# Patient Record
Sex: Female | Born: 1965 | Race: Black or African American | Hispanic: No | Smoking: Never smoker
Health system: Southern US, Community
[De-identification: ages and names within clinical notes are randomized; demographics above are authoritative.]

## PROBLEM LIST (undated history)

## (undated) DIAGNOSIS — I10 Essential (primary) hypertension: Secondary | ICD-10-CM

## (undated) HISTORY — PX: WISDOM TOOTH EXTRACTION: SHX21

## (undated) HISTORY — PX: ABDOMINAL HYSTERECTOMY: SHX81

---

## 2014-12-08 ENCOUNTER — Emergency Department (HOSPITAL_BASED_OUTPATIENT_CLINIC_OR_DEPARTMENT_OTHER)
Admission: EM | Admit: 2014-12-08 | Discharge: 2014-12-08 | Disposition: A | Discharge status: Home / Self Care | Payer: Managed Care, Other (non HMO) | Attending: Emergency Medicine | Admitting: Emergency Medicine

## 2014-12-08 ENCOUNTER — Encounter (HOSPITAL_BASED_OUTPATIENT_CLINIC_OR_DEPARTMENT_OTHER): Payer: Self-pay | Admitting: *Deleted

## 2014-12-08 DIAGNOSIS — I10 Essential (primary) hypertension: Secondary | ICD-10-CM | POA: Diagnosis not present

## 2014-12-08 DIAGNOSIS — K088 Other specified disorders of teeth and supporting structures: Secondary | ICD-10-CM | POA: Diagnosis not present

## 2014-12-08 DIAGNOSIS — K0889 Other specified disorders of teeth and supporting structures: Secondary | ICD-10-CM

## 2014-12-08 HISTORY — DX: Essential (primary) hypertension: I10

## 2014-12-08 MED ORDER — AMOXICILLIN 500 MG PO CAPS: 500.0000 mg | ORAL_CAPSULE | Freq: Three times a day (TID) | ORAL | Status: DC

## 2014-12-08 MED ORDER — OXYCODONE HCL 5 MG PO TABS
5.0000 mg | ORAL_TABLET | Freq: Once | ORAL | Status: AC
  Administered 2014-12-08: 5 mg via ORAL
  Filled 2014-12-08: qty 1

## 2014-12-08 MED ORDER — AMOXICILLIN 500 MG PO CAPS
500.0000 mg | ORAL_CAPSULE | Freq: Once | ORAL | Status: AC
  Administered 2014-12-08: 500 mg via ORAL
  Filled 2014-12-08: qty 1

## 2014-12-08 MED ORDER — OXYCODONE HCL 5 MG PO TABS: 5.0000 mg | ORAL_TABLET | ORAL | Status: DC | PRN

## 2014-12-08 NOTE — Discharge Instructions (Signed)
Please follow with your primary care doctor in the next 5 days for high blood pressure evaluation. If you do not have a primary care doctor, present to urgent care. Reduce salt intake. Seek emergency medical care for unilateral weakness, slurring, change in vision, or chest pain and shortness of breath.  You can take acetaminophen up to 3000 mg per day. Do not combine this with alcohol, do not take any other medications that contain acetaminophen (you must read the labels)  Take oxycodone for breakthrough pain, do not drink alcohol, drive, care for children or do other critical tasks while taking oxycodone.  Return to the emergency room for fever, change in vision, redness to the face that rapidly spreads towards the eye, nausea or vomiting, difficulty swallowing or shortness of breath.   Apply warm compresses to jaw throughout the day.   Take your antibiotics as directed and to the end of the course. DO NOT drink alcohol when taking metronidazole, it will make you very sick!   Followup with a dentist is very important for ongoing evaluation and management of recurrent dental pain. Return to emergency department for emergent changing or worsening symptoms."  Low-cost dental clinic: [facility]  at [phone]**  **Nuala Alpha at [phone] [address]    You may also call [phone]  Dental Assistance If the dentist on-call cannot see you, please use the resources below:   Patients with Medicaid: [facility] [phone] [address], [phone] [phone]  If unable to pay, or uninsured, contact [facility] [phone]) or [facility] Department [phone] in [place], [phone] in [facility]) to become qualified for the adult dental clinic  Other Low-Cost Community Dental Services: [facility]- [address] Natasha Bence [place], [place], [address]    [phone], Ext. [phone]    2nd and 4th Thursday of the month at 6:30am    10  clients each day by appointment, can sometimes see walk-in     patients if someone does not show for an appointment [facility]- [address] Ether Griffins [place], [place], [address]    [phone] [facility] [address], [place], [place], [address]    [phone]  [facility]- [phone] [facility]- [phone] [facility]- [phone]

## 2014-12-08 NOTE — ED Notes (Signed)
L side dental pain/jaw pain, upper and lower jaw

## 2014-12-08 NOTE — ED Provider Notes (Signed)
CSN: [id number]     Arrival date & time 12/08/14  1623 History   First MD Initiated Contact with Patient 12/08/14 1637     Chief Complaint  Patient presents with  . Dental Pain     (Consider location/radiation/quality/duration/timing/severity/associated sxs/prior Treatment) HPI   Blood pressure 161/97, pulse 80, temperature 98.7 F (37.1 C), temperature source Oral, resp. rate 18, height 5\' 3"  (1.6 m), weight 200 lb (90.719 kg), SpO2 99 %.  Raven Coleman is a 49 y.o. female complaining of left upper and lower dental pain which she rates at 6 out of 10 onset 3 days ago. Patient has taken acetaminophen extra strength for a total of 1000 mg one hour ago in addition to 600 mg of naproxen with little relief. She states that the pain is severe and keeping her from sleeping at night. Denies fever/chills, difficulty opening jaw, difficulty swallowing, SOB, gum swelling, facial swelling, neck swelling, chest pain, shortness of breath.    Past Medical History  Diagnosis Date  . Hypertension    Past Surgical History  Procedure Laterality Date  . Abdominal hysterectomy    . Wisdom tooth extraction     No family history on file. History  Substance Use Topics  . Smoking status: Never Smoker   . Smokeless tobacco: Not on file  . Alcohol Use: No   OB History    No data available     Review of Systems  10 systems reviewed and found to be negative, except as noted in the HPI.  Allergies  Review of patient's allergies indicates no known allergies.  Home Medications   Prior to Admission medications   Medication Sig Start Date End Date Taking? Authorizing Provider  LISINOPRIL PO Take by mouth.   Yes Historical Provider, MD  amoxicillin (AMOXIL) 500 MG capsule Take 1 capsule (500 mg total) by mouth 3 (three) times daily. 12/08/14   Mahmoud Blazejewski, PA-C  oxyCODONE (ROXICODONE) 5 MG immediate release tablet Take 1 tablet (5 mg total) by mouth every 4 (four) hours as needed. Take 1-2  tablets every 4-6 hours as needed for pain control 12/08/14   Joni Reining Jaaziah Schulke, PA-C   BP 161/97 mmHg  Pulse 80  Temp(Src) 98.7 F (37.1 C) (Oral)  Resp 18  Ht 5\' 3"  (1.6 m)  Wt 200 lb (90.719 kg)  BMI 35.44 kg/m2  SpO2 99% Physical Exam  Constitutional: She is oriented to person, place, and time. She appears well-developed and well-nourished. No distress.  HENT:  Head: Normocephalic.  Mouth/Throat: Uvula is midline and oropharynx is clear and moist. No trismus in the jaw. No uvula swelling. No oropharyngeal exudate, posterior oropharyngeal edema, posterior oropharyngeal erythema or tonsillar abscesses.  Generally poor dentition, no gingival swelling, erythema or tenderness to palpation. Patient is handling their secretions. There is no tenderness to palpation or firmness underneath tongue bilaterally. No trismus.    Eyes: Conjunctivae and EOM are normal.  Cardiovascular: Normal rate.   Pulmonary/Chest: Effort normal. No stridor.  Musculoskeletal: Normal range of motion.  Lymphadenopathy:    She has no cervical adenopathy.  Neurological: She is alert and oriented to person, place, and time.  Psychiatric: She has a normal mood and affect.  Nursing note and vitals reviewed.   ED Course  Procedures (including critical care time) Labs Review Labs Reviewed - No data to display  Imaging Review No results found.   EKG Interpretation None      MDM   Final diagnoses:  Pain, dental  Filed Vitals:   12/08/14 1630  BP: 161/97  Pulse: 80  Temp: 98.7 F (37.1 C)  TempSrc: Oral  Resp: 18  Height:  (1.6 m)  Weight: 200 lb (90.719 kg)  SpO2: 99%    Medications  amoxicillin (AMOXIL) capsule 500 mg (not administered)  oxyCODONE (Oxy IR/ROXICODONE) immediate release tablet 5 mg (not administered)    Raven Coleman is a pleasant 49 y.o. female presenting with dental pain associated with dental caries but no signs or symptoms of dental abscess. Patient afebrile, non  toxic appearing and swallowing secretions well. I gave patient referral to dentist and stressed the importance of dental follow up for definitive management of dental issues. Patient voices understanding and is agreeable to plan.  Evaluation does not show pathology that would require ongoing emergent intervention or inpatient treatment. Pt is hemodynamically stable and mentating appropriately. Discussed findings and plan with patient/guardian, who agrees with care plan. All questions answered. Return precautions discussed and outpatient follow up given.   New Prescriptions   AMOXICILLIN (AMOXIL) 500 MG CAPSULE    Take 1 capsule (500 mg total) by mouth 3 (three) times daily.   OXYCODONE (ROXICODONE) 5 MG IMMEDIATE RELEASE TABLET    Take 1 tablet (5 mg total) by mouth every 4 (four) hours as needed. Take 1-2 tablets every 4-6 hours as needed for pain control         Wynetta Emery, PA-C 12/08/14 1653  Benjiman Core, MD 12/11/14 1414

## 2015-02-02 ENCOUNTER — Encounter (HOSPITAL_BASED_OUTPATIENT_CLINIC_OR_DEPARTMENT_OTHER): Payer: Self-pay | Admitting: *Deleted

## 2015-02-02 ENCOUNTER — Emergency Department (HOSPITAL_BASED_OUTPATIENT_CLINIC_OR_DEPARTMENT_OTHER)
Admission: EM | Admit: 2015-02-02 | Discharge: 2015-02-02 | Disposition: A | Discharge status: Home / Self Care | Payer: Managed Care, Other (non HMO) | Attending: Physician Assistant | Admitting: Physician Assistant

## 2015-02-02 DIAGNOSIS — H9209 Otalgia, unspecified ear: Secondary | ICD-10-CM | POA: Diagnosis not present

## 2015-02-02 DIAGNOSIS — J029 Acute pharyngitis, unspecified: Secondary | ICD-10-CM | POA: Diagnosis present

## 2015-02-02 DIAGNOSIS — Z792 Long term (current) use of antibiotics: Secondary | ICD-10-CM | POA: Diagnosis not present

## 2015-02-02 DIAGNOSIS — H578 Other specified disorders of eye and adnexa: Secondary | ICD-10-CM | POA: Diagnosis not present

## 2015-02-02 DIAGNOSIS — J302 Other seasonal allergic rhinitis: Secondary | ICD-10-CM | POA: Diagnosis not present

## 2015-02-02 DIAGNOSIS — I10 Essential (primary) hypertension: Secondary | ICD-10-CM | POA: Diagnosis not present

## 2015-02-02 LAB — RAPID STREP SCREEN (MED CTR MEBANE ONLY): Streptococcus, Group A Screen (Direct): NEGATIVE

## 2015-02-02 MED ORDER — CETIRIZINE-PSEUDOEPHEDRINE ER 5-120 MG PO TB12: 1.0000 | ORAL_TABLET | Freq: Two times a day (BID) | ORAL | Status: DC

## 2015-02-02 NOTE — ED Provider Notes (Signed)
CSN: [id number]     Arrival date & time 02/02/15  1514 History   First MD Initiated Contact with Patient 02/02/15 1645     Chief Complaint  Patient presents with  . Sore Throat     (Consider location/radiation/quality/duration/timing/severity/associated sxs/prior Treatment) HPI Comments: Pt is a 49 yo female who presents to the ED with complaint of sore throat, on 1 week. She also endorses associated rhinorrhea, ear pain, sneezing, watering of eyes. Denies fever, chills, headahce, visual changes, cough, SOB, CP, abdominal pain, N/V/D, urinary sxs, numbness, tingling, weakness. Denies using any form of tx PTA. Denies any sick contacts.    Past Medical History  Diagnosis Date  . Hypertension    Past Surgical History  Procedure Laterality Date  . Abdominal hysterectomy    . Wisdom tooth extraction     No family history on file. Social History  Substance Use Topics  . Smoking status: Never Smoker   . Smokeless tobacco: Never Used  . Alcohol Use: No   OB History    No data available     Review of Systems  Constitutional: Negative for fever and chills.  HENT: Positive for ear pain, rhinorrhea, sneezing and sore throat. Negative for congestion and sinus pressure.   Eyes: Positive for discharge. Negative for redness and itching.  Respiratory: Negative for cough and shortness of breath.   Cardiovascular: Negative for chest pain.  Gastrointestinal: Negative for nausea, vomiting and abdominal pain.  Neurological: Negative for weakness and headaches.      Allergies  Review of patient's allergies indicates no known allergies.  Home Medications   Prior to Admission medications   Medication Sig Start Date End Date Taking? Authorizing Provider  Naproxen (NAPROSYN PO) Take by mouth.   Yes Historical Provider, MD  amoxicillin (AMOXIL) 500 MG capsule Take 1 capsule (500 mg total) by mouth 3 (three) times daily. 12/08/14   Nicole Pisciotta, PA-C  LISINOPRIL PO Take by mouth.     Historical Provider, MD  oxyCODONE (ROXICODONE) 5 MG immediate release tablet Take 1 tablet (5 mg total) by mouth every 4 (four) hours as needed. Take 1-2 tablets every 4-6 hours as needed for pain control 12/08/14   Joni Reining Pisciotta, PA-C   BP 144/72 mmHg  Pulse 72  Temp(Src) 98.6 F (37 C) (Oral)  Resp 18  Ht  (1.6 m)  Wt 200 lb (90.719 kg)  BMI 35.44 kg/m2  SpO2 100% Physical Exam  Constitutional: She is oriented to person, place, and time. She appears well-developed and well-nourished.  HENT:  Head: Normocephalic and atraumatic.  Right Ear: Tympanic membrane and external ear normal.  Left Ear: Tympanic membrane and external ear normal.  Nose: Rhinorrhea present. Right sinus exhibits no maxillary sinus tenderness and no frontal sinus tenderness. Left sinus exhibits no maxillary sinus tenderness and no frontal sinus tenderness.  Mouth/Throat: Uvula is midline, oropharynx is clear and moist and mucous membranes are normal. No oropharyngeal exudate.  Eyes: Conjunctivae and EOM are normal. Pupils are equal, round, and reactive to light. Right eye exhibits no chemosis and no discharge. Left eye exhibits no chemosis and no discharge. Right conjunctiva is not injected. No scleral icterus.  Neck: Normal range of motion. Neck supple.  Cardiovascular: Normal rate, regular rhythm, normal heart sounds and intact distal pulses.   No murmur heard. Pulmonary/Chest: Effort normal and breath sounds normal. No respiratory distress. She has no wheezes. She has no rales. She exhibits no tenderness.  Abdominal: Soft. Bowel sounds are normal. She  exhibits no distension and no mass. There is no tenderness. There is no rebound and no guarding.  Musculoskeletal: She exhibits no edema.  Lymphadenopathy:    She has cervical adenopathy (left submandibular ).  Neurological: She is alert and oriented to person, place, and time.  Skin: Skin is warm and dry.  Nursing note and vitals reviewed.   ED Course   Procedures (including critical care time) Labs Review Labs Reviewed  RAPID STREP SCREEN (NOT AT [facility])  CULTURE, GROUP A STREP    Imaging Review No results found. I have personally reviewed and evaluated these images and lab results as part of my medical decision-making.  Filed Vitals:   02/02/15 1750  BP: 152/80  Pulse: 64  Temp:   Resp: 18     MDM   Final diagnoses:  Seasonal allergies    Pt presents with sore throat, sneezing, rhinorrhea, watering of eyes and ear pain for the past week. Denies sick contacts. VSS. Exam only revealed rhinorrhea and submandibular lymphadenopathy. Strep negative. Pt reports having allergies but does not take any meds. I suspect pt's presentation is most likely due to allergies. Plan to d/c pt home with rx for zyrtec. Pt advised to follow up with PCP, given resource guide.  Evaluation does not show pathology requring ongoing emergent intervention or admission. Pt is hemodynamically stable and mentating appropriately. Discussed findings/results and plan with patient/guardian, who agrees with plan. All questions answered. Return precautions discussed and outpatient follow up given.      Satira Sark [place], [place] 02/03/15 1239  Courteney Randall An, MD 02/06/15 [phone]

## 2015-02-02 NOTE — Discharge Instructions (Signed)
Please take your prescriptions as prescribed. Please follow up with a primary care provider from the Resource Guide provided below in 1 week. Please return to the Emergency Department if symptoms worsen or new onset of fever, chills, difficulty breathing, chest pain.    Emergency Department Resource Guide 1) Find a Doctor and Pay Out of Pocket Although you won't have to find out who is covered by your insurance plan, it is a good idea to ask around and get recommendations. You will then need to call the office and see if the doctor you have chosen will accept you as a new patient and what types of options they offer for patients who are self-pay. Some doctors offer discounts or will set up payment plans for their patients who do not have insurance, but you will need to ask so you aren't surprised when you get to your appointment.  2) Contact Your Local Health Department Not all health departments have doctors that can see patients for sick visits, but many do, so it is worth a call to see if yours does. If you don't know where your local health department is, you can check in your phone book. The CDC also has a tool to help you locate your state's health department, and many state websites also have listings of all of their local health departments.  3) Find a Walk-in Clinic If your illness is not likely to be very severe or complicated, you may want to try a walk in clinic. These are popping up all over the country in pharmacies, drugstores, and shopping centers. They're usually staffed by nurse practitioners or physician assistants that have been trained to treat common illnesses and complaints. They're usually fairly quick and inexpensive. However, if you have serious medical issues or chronic medical problems, these are probably not your best option.  No Primary Care Doctor: - Call Health Connect at  [phone] - they can help you locate a primary care doctor that  accepts your insurance, provides  certain services, etc. - Physician Referral Service- [phone]  Chronic Pain Problems: Organization         Address  Phone   Notes  [facility]  [phone] Patients need to be referred by their primary care doctor.   Medication Assistance: Organization         Address  Phone   Notes  [facility] Medication [facility] [address] [place] [place], [place] [address] [phone] --Must be a resident of [facility] -- Must have NO insurance coverage whatsoever (no Medicaid/ Medicare, etc.) -- The pt. MUST have a primary care doctor that directs their care regularly and follows them in the community   MedAssist  [phone]   [employer]  [phone]    Agencies that provide inexpensive medical care: Organization         Address  Phone   Notes  [facility]  [phone]   [facility]    [phone]   [facility] [address] [place], [place] [address] [phone]   [facility] [address] [place], [place] [phone]   Planned Parenthood    [phone]   [facility]    [phone]   [facility]  [address], [place] Phone:  [phone], Fax:  [phone] Hours of Operation:  9 am - 6 pm, M-F.  Also  accepts Medicaid/Medicare and self-pay.  [facility] for [place], [address], [place] Phone: [phone], Fax: [phone]. Hours of Operation:  8:30 am - 5:30 pm, M-F.  Also accepts Medicaid and self-pay.  [facility] [place] [address] [place] Phone: [phone]   [place], [place], [place] [phone], Ext. [phone] Mondays & Thursdays: 7-9 AM.  First 15 patients are seen on a first come, first serve basis.    [place] Providers:  Organization         Address  Phone   Notes  [facility] [address], [place] [phone] Also accepts self-pay patients.  [facility] [address], [place]  [phone]   [place], [address], [place] [phone]   [facility] [address] [place] [phone]   Lucianne Lei [address], [place]   [phone] Only accepts [place] Access [place] patients after they have their name applied to their card.   Self-Pay (no insurance) in [facility]:  Organization         Address  Phone   Notes  Sickle Cell Patients, [facility] [place] [phone]   [facility] [place] [phone]   [facility] [place]  [place], [place], [place] [phone]   [facility]/Dr. Osei-Bonsu  [address] [place] or [address], [place] [phone] Phone number for both [place] and [place] locations is the same.  [facility] [address] [place] [phone]   [facility] [address] [place] or [address] [phone]   [facility] [address], [place] [phone], phone; [phone], fax Sees patients 1st and 3rd Saturday of every month.  Must not qualify for public or private insurance (i.e. Medicaid, Medicare, [place] Health Choice, Veterans' Benefits)  Household income should be no more than 200% of the poverty level The clinic cannot treat you if you are pregnant or think you are pregnant  Sexually transmitted diseases are not treated at the clinic.    Dental Care: Organization         Address  Phone  Notes  [facility] Department of [facility] [place] [phone]  Accepts children up to age 94 who are enrolled in [place] or [place]; pregnant women with a Medicaid card; and children who have applied for Medicaid or [place] Health Choice, but were declined, whose parents can pay a reduced fee at time of service.  [facility] Department of [facility]  [address], [place] [phone] Accepts children up to age 3 who are enrolled in [place] or [place]; pregnant women with a Medicaid card; and children who have applied for Medicaid or [place] Health Choice, but were declined, whose parents can pay a reduced fee at time of service.  [place] Adult [facility] Access PROGRAM  [place] [phone] Patients are seen by appointment only. Walk-ins are not accepted. [place] will see patients 32 years of age and older. Monday - Tuesday (8am-5pm)  Most Wednesdays (8:30-5pm) $30 per visit, cash only  [facility] Adult [employer] PROGRAM  [address], [facility] [phone] Patients are seen by appointment only. Walk-ins are not accepted. [place] will see patients 73 years of age and older. One Wednesday Evening (Monthly: Volunteer Based).  $30 per visit, cash only  [place]  [phone] for adults; Children under age 71, call Graduate Pediatric Dentistry at [phone]. Children aged 11-14, please call [phone] to request a pediatric application.  Dental services are provided in all areas of dental care including fillings, crowns and bridges, complete and partial dentures, implants, gum treatment, root canals, and extractions. Preventive care is also provided. Treatment is provided to both adults and children. Patients are selected via a lottery and there is often a waiting list.   [facility] [address], [place]  [phone] [email or web address]   [facility] [address] [place], [place] [phone], Ext. [phone] Second  and Fourth Thursday of each month, opens at 6:30 AM; Clinic ends at 9 AM.  Patients are seen on a first-come first-served basis, and a limited number are seen during each clinic.   [facility]  [address] [place] Danker [place], [place] [phone]   Eligibility Requirements You must have lived in [place], [place], or [place] counties for at least the last three months.   You cannot be eligible for state or federal sponsored [employer], including [employer], [place], or [employer].   You generally cannot be eligible for healthcare insurance through your employer.    How to apply: Eligibility screenings are held every Tuesday and Wednesday afternoon from 1:00 pm until 4:00 pm. You do not need an appointment for the interview!  [facility] [address] [place], [place]   [place]  [place] Department  Day  [phone]    Behavioral Health Resources in the Community: Intensive Outpatient Programs Organization         Address  Phone  Notes  [place] [facility]. [address], [place], [place] [phone]   [facility] Outpatient [address], [place], [place]   ADS: Alcohol & Drug Svcs [address], [place], [place]   [place] [address]  [place], [place] or [phone]   Substance Abuse Resources Organization         Address  Phone  Notes  Alcohol and Drug Services  [phone]   [place]  [phone]   [facility] [place]   Chinita Pester  [phone]   Residential & Outpatient Substance Abuse Program  [phone]   Psychological Services Organization         Address  Phone  Notes  [facility] [place]  [place]  [phone]   [place] [address], [place] or [phone]    Mobile Crisis Teams Organization         Address  Phone  Notes  [facility], Mobile Crisis Care Unit  [phone]   [facility]  [address] [place], [place]   [place] Levels [address], [place] [phone]    Self-Help/Support Groups Organization         Address  Phone  Notes  [facility] of [place] - variety of support groups  [place] Call for more information  Narcotics Anonymous (NA), [facility] [address], [employer]   2 meetings at this location   Special educational needs teacher         Address  Phone  Notes  ASAP Residential Treatment [place],    [place]  [phone]   [facility]  [address] [place] 326712, [place], [place]   [place], [place] [phone] Admissions: 8am-3pm M-F  Incentives Substance [place] [address]    [place], [place] [phone]   [facility] [address] [place], [place], [place]   [facility] [address],  [place], [place]   [facility]ight Programs - Intensive Outpatient [address], [place], [place]   [facility] ([place].,  [place], [place] [phone] or [phone]   [facility] (RTS) [address] [place], [place] Accepts Medicaid  [facility] [place] [address]  [place] [phone] Substance Abuse/Addiction Treatment   [facility] Organization         Address  Phone  Notes  [facility]  [phone]   Domenic Schwab, PhD [address] Arlis [place], [place]   [phone] or [phone]   [place], [place]  [phone]   [facility] [address] [place], [place] [phone] Insurance/Medicaid/sponsorship through [facility] and Families [address]                                    [place], [place] [phone] [place] [address], [place] [phone]    Dr. Adele Schilder  [phone]   [facility] Dept. 1) [address], [place] 2) [place] 3)  [place] [address], [place] [phone]  [phone]   [place] [phone] or [phone] (After Hours)

## 2015-02-02 NOTE — ED Notes (Signed)
Pt reports sore throat, headache, ear pain x 1 week

## 2015-02-05 LAB — CULTURE, GROUP A STREP: STREP A CULTURE: NEGATIVE

## 2015-02-06 ENCOUNTER — Emergency Department (HOSPITAL_BASED_OUTPATIENT_CLINIC_OR_DEPARTMENT_OTHER)
Admission: EM | Admit: 2015-02-06 | Discharge: 2015-02-06 | Disposition: A | Discharge status: Home / Self Care | Payer: Managed Care, Other (non HMO) | Attending: Emergency Medicine | Admitting: Emergency Medicine

## 2015-02-06 ENCOUNTER — Encounter (HOSPITAL_BASED_OUTPATIENT_CLINIC_OR_DEPARTMENT_OTHER): Payer: Self-pay | Admitting: *Deleted

## 2015-02-06 DIAGNOSIS — Z79899 Other long term (current) drug therapy: Secondary | ICD-10-CM | POA: Diagnosis not present

## 2015-02-06 DIAGNOSIS — Z792 Long term (current) use of antibiotics: Secondary | ICD-10-CM | POA: Diagnosis not present

## 2015-02-06 DIAGNOSIS — K0889 Other specified disorders of teeth and supporting structures: Secondary | ICD-10-CM | POA: Diagnosis not present

## 2015-02-06 DIAGNOSIS — I10 Essential (primary) hypertension: Secondary | ICD-10-CM | POA: Diagnosis not present

## 2015-02-06 MED ORDER — PENICILLIN V POTASSIUM 250 MG PO TABS
500.0000 mg | ORAL_TABLET | Freq: Once | ORAL | Status: AC
  Administered 2015-02-06: 500 mg via ORAL
  Filled 2015-02-06: qty 2

## 2015-02-06 MED ORDER — TRAMADOL HCL 50 MG PO TABS: 50.0000 mg | ORAL_TABLET | Freq: Four times a day (QID) | ORAL | Status: DC | PRN

## 2015-02-06 MED ORDER — PENICILLIN V POTASSIUM 500 MG PO TABS: 500.0000 mg | ORAL_TABLET | Freq: Three times a day (TID) | ORAL | Status: DC

## 2015-02-06 NOTE — ED Provider Notes (Signed)
CSN: [id number]     Arrival date & time 02/06/15  4098 History   First MD Initiated Contact with Patient 02/06/15 1847     Chief Complaint  Patient presents with  . Dental Pain     (Consider location/radiation/quality/duration/timing/severity/associated sxs/prior Treatment) HPI Comments: Patient presents with dental pain. She states her left upper tooth it's hurt for about a week. It's gradually been getting worse. She denies any facial swelling. There is no nausea vomiting or fevers. She's been using over-the-counter medicines without relief. She has an appointment to follow-up with her dentist next week.  Patient is a 49 y.o. female presenting with tooth pain.  Dental Pain Associated symptoms: no congestion, no facial swelling, no fever and no headaches     Past Medical History  Diagnosis Date  . Hypertension    Past Surgical History  Procedure Laterality Date  . Abdominal hysterectomy    . Wisdom tooth extraction     No family history on file. Social History  Substance Use Topics  . Smoking status: Never Smoker   . Smokeless tobacco: Never Used  . Alcohol Use: No   OB History    No data available     Review of Systems  Constitutional: Negative for fever and fatigue.  HENT: Positive for dental problem. Negative for congestion, facial swelling, postnasal drip and sore throat.   Gastrointestinal: Negative for nausea and vomiting.  Skin: Negative for rash and wound.  Neurological: Negative for light-headedness and headaches.      Allergies  Review of patient's allergies indicates no known allergies.  Home Medications   Prior to Admission medications   Medication Sig Start Date End Date Taking? Authorizing Provider  amoxicillin (AMOXIL) 500 MG capsule Take 1 capsule (500 mg total) by mouth 3 (three) times daily. 12/08/14   Nicole Pisciotta, PA-C  cetirizine-pseudoephedrine (ZYRTEC-D) 5-120 MG tablet Take 1 tablet by mouth 2 (two) times daily. 02/02/15   Barrett Henle, PA-C  LISINOPRIL PO Take by mouth.    Historical Provider, MD  Naproxen (NAPROSYN PO) Take by mouth.    Historical Provider, MD  oxyCODONE (ROXICODONE) 5 MG immediate release tablet Take 1 tablet (5 mg total) by mouth every 4 (four) hours as needed. Take 1-2 tablets every 4-6 hours as needed for pain control 12/08/14   Joni Reining Pisciotta, PA-C  penicillin v potassium (VEETID) 500 MG tablet Take 1 tablet (500 mg total) by mouth 3 (three) times daily. 02/06/15   Rolan Bucco, MD  traMADol (ULTRAM) 50 MG tablet Take 1 tablet (50 mg total) by mouth every 6 (six) hours as needed. 02/06/15   Rolan Bucco, MD   BP 147/74 mmHg  Pulse 78  Temp(Src) 97.8 F (36.6 C) (Oral)  Resp 20  Ht  (1.6 m)  Wt 204 lb (92.534 kg)  BMI 36.15 kg/m2  SpO2 100% Physical Exam  Constitutional: She is oriented to person, place, and time. She appears well-developed and well-nourished.  HENT:  Positive tenderness along the left upper bicuspid. There is no facial swelling. No induration or fluctuance. No trismus.  Cardiovascular: Normal rate.   Pulmonary/Chest: Effort normal.  Neurological: She is alert and oriented to person, place, and time.  Skin: Skin is warm and dry.    ED Course  Procedures (including critical care time) Labs Review Labs Reviewed - No data to display  Imaging Review No results found. I have personally reviewed and evaluated these images and lab results as part of my medical decision-making.  EKG Interpretation None      MDM   Final diagnoses:  Pain, dental    Patient presents with dental pain. She has no evidence of a periapical abscess. She was treated with penicillin and tramadol. She has an appointment to follow-up next week with her dentist.    Rolan Bucco, MD 02/06/15 1910

## 2015-02-06 NOTE — ED Notes (Signed)
Dental pain for a week. She cannot see her MD til next week.

## 2015-10-20 ENCOUNTER — Encounter (HOSPITAL_BASED_OUTPATIENT_CLINIC_OR_DEPARTMENT_OTHER): Payer: Self-pay | Admitting: *Deleted

## 2015-10-20 ENCOUNTER — Emergency Department (HOSPITAL_BASED_OUTPATIENT_CLINIC_OR_DEPARTMENT_OTHER): Payer: Managed Care, Other (non HMO)

## 2015-10-20 ENCOUNTER — Emergency Department (HOSPITAL_BASED_OUTPATIENT_CLINIC_OR_DEPARTMENT_OTHER)
Admission: EM | Admit: 2015-10-20 | Discharge: 2015-10-21 | Disposition: A | Discharge status: Home / Self Care | Payer: Managed Care, Other (non HMO) | Attending: Emergency Medicine | Admitting: Emergency Medicine

## 2015-10-20 DIAGNOSIS — Z79899 Other long term (current) drug therapy: Secondary | ICD-10-CM | POA: Insufficient documentation

## 2015-10-20 DIAGNOSIS — S9032XA Contusion of left foot, initial encounter: Secondary | ICD-10-CM

## 2015-10-20 DIAGNOSIS — W228XXA Striking against or struck by other objects, initial encounter: Secondary | ICD-10-CM | POA: Diagnosis not present

## 2015-10-20 DIAGNOSIS — I10 Essential (primary) hypertension: Secondary | ICD-10-CM | POA: Diagnosis not present

## 2015-10-20 DIAGNOSIS — Y9289 Other specified places as the place of occurrence of the external cause: Secondary | ICD-10-CM | POA: Diagnosis not present

## 2015-10-20 DIAGNOSIS — S99922A Unspecified injury of left foot, initial encounter: Secondary | ICD-10-CM | POA: Diagnosis present

## 2015-10-20 DIAGNOSIS — Y939 Activity, unspecified: Secondary | ICD-10-CM | POA: Insufficient documentation

## 2015-10-20 DIAGNOSIS — Y990 Civilian activity done for income or pay: Secondary | ICD-10-CM | POA: Diagnosis not present

## 2015-10-20 MED ORDER — IBUPROFEN 800 MG PO TABS: 800.0000 mg | ORAL_TABLET | Freq: Three times a day (TID) | ORAL | Status: DC | PRN

## 2015-10-20 NOTE — ED Notes (Signed)
Pt made aware to return if symptoms worsen or if any life threatening symptoms occur.   

## 2015-10-20 NOTE — Discharge Instructions (Signed)
Return here as needed. Follow up with your foot doctor. Ice and elevate your foot.

## 2015-10-20 NOTE — ED Notes (Signed)
Pt requesting to speak to PA, PA notified.

## 2015-10-20 NOTE — ED Notes (Addendum)
Pain in her feet for 5 years. 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 She was diagnosed with plantar fasciitis by a foot specialist at the time of the pain. She has had worsened pain and a bruise on the side of her left foot for a month.

## 2015-10-22 NOTE — ED Provider Notes (Signed)
CSN: [id number]     Arrival date & time 10/20/15  1848 History   First MD Initiated Contact with Patient 10/20/15 2001     Chief Complaint  Patient presents with  . Foot Pain     (Consider location/radiation/quality/duration/timing/severity/associated sxs/prior Treatment) HPI Patient presents to the emergency department with left foot pain that has been worse over the last few days.  Patient said she has chronic foot pain from plantar fasciitis.  The patient states she noticed some bruising along the left lateral aspect of her foot.  Patient states that she does not recall an injury other than a box hitting her in the foot at work.  The patient states nothing seems to make the condition better, but standing and movement make the pain worse.  The patient states she did not take any medications prior to arrival.  Patient denies any numbness or weakness in the foot Past Medical History  Diagnosis Date  . Hypertension    Past Surgical History  Procedure Laterality Date  . Abdominal hysterectomy    . Wisdom tooth extraction     No family history on file. Social History  Substance Use Topics  . Smoking status: Never Smoker   . Smokeless tobacco: Never Used  . Alcohol Use: No   OB History    No data available     Review of Systems  All other systems negative except as documented in the HPI. All pertinent positives and negatives as reviewed in the HPI.  Allergies  Review of patient's allergies indicates no known allergies.  Home Medications   Prior to Admission medications   Medication Sig Start Date End Date Taking? Authorizing Provider  HYDROCHLOROTHIAZIDE PO Take by mouth.   Yes Historical Provider, MD  LISINOPRIL PO Take by mouth.   Yes Historical Provider, MD  Naproxen (NAPROSYN PO) Take by mouth.   Yes Historical Provider, MD  amoxicillin (AMOXIL) 500 MG capsule Take 1 capsule (500 mg total) by mouth 3 (three) times daily. 12/08/14   Nicole Pisciotta, PA-C    cetirizine-pseudoephedrine (ZYRTEC-D) 5-120 MG tablet Take 1 tablet by mouth 2 (two) times daily. 02/02/15   Barrett Henle, PA-C  ibuprofen (ADVIL,MOTRIN) 800 MG tablet Take 1 tablet (800 mg total) by mouth every 8 (eight) hours as needed. 10/20/15   Charlestine Night, PA-C  oxyCODONE (ROXICODONE) 5 MG immediate release tablet Take 1 tablet (5 mg total) by mouth every 4 (four) hours as needed. Take 1-2 tablets every 4-6 hours as needed for pain control 12/08/14   Joni Reining Pisciotta, PA-C  penicillin v potassium (VEETID) 500 MG tablet Take 1 tablet (500 mg total) by mouth 3 (three) times daily. 02/06/15   Rolan Bucco, MD  traMADol (ULTRAM) 50 MG tablet Take 1 tablet (50 mg total) by mouth every 6 (six) hours as needed. 02/06/15   Rolan Bucco, MD   BP 151/102 mmHg  Pulse 80  Temp(Src) 97.9 F (36.6 C) (Oral)  Resp 20  Ht  (1.6 m)  Wt 90.719 kg  BMI 35.44 kg/m2  SpO2 100% Physical Exam  Constitutional: She is oriented to person, place, and time. She appears well-developed and well-nourished.  HENT:  Head: Normocephalic and atraumatic.  Pulmonary/Chest: Effort normal.  Musculoskeletal:       Feet:  Neurological: She is alert and oriented to person, place, and time.  Skin: Skin is warm and dry. No rash noted. No erythema.    ED Course  Procedures (including critical care time) Labs Review Labs  Reviewed - No data to display  Imaging Review Dg Foot Complete Left  10/20/2015  CLINICAL DATA:  Pain in her feet for 5 years. 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 She was diagnosed with plantar fasciitis by a foot specialist at the time of the pain. She has had worsened pain and a bruise on the side of her left foot for a month. EXAM: LEFT FOOT - COMPLETE 3+ VIEW COMPARISON:  None. FINDINGS: There is no evidence of fracture or dislocation. Small calcaneal spur at the plantar aponeurosis. There is no evidence of arthropathy or other focal bone abnormality. Soft tissues are unremarkable. IMPRESSION:  Calcaneal spur.  Otherwise negative. Electronically Signed   By: Corlis Leak  Hassell M.D.   On: 10/20/2015 20:37   I have personally reviewed and evaluated these images and lab results as part of my medical decision-making.   EKG Interpretation None      MDM   Final diagnoses:  Foot contusion, left, initial encounter    It should be treated for contusion of the foot.  Told to follow up the foot.  Dr. she is seen in the past.  Patient agrees the plan and all questions were answered.  I advised ice and elevate her foot   Charlestine NightChristopher Nyashia Raney, PA-C 10/22/15 0214  Lyndal Pulleyaniel Knott, MD 10/22/15 [phone]

## 2016-05-09 ENCOUNTER — Emergency Department (HOSPITAL_BASED_OUTPATIENT_CLINIC_OR_DEPARTMENT_OTHER)
Admission: EM | Admit: 2016-05-09 | Discharge: 2016-05-09 | Disposition: A | Discharge status: Home / Self Care | Payer: 59 | Attending: Emergency Medicine | Admitting: Emergency Medicine

## 2016-05-09 ENCOUNTER — Encounter (HOSPITAL_BASED_OUTPATIENT_CLINIC_OR_DEPARTMENT_OTHER): Payer: Self-pay | Admitting: Emergency Medicine

## 2016-05-09 DIAGNOSIS — J3489 Other specified disorders of nose and nasal sinuses: Secondary | ICD-10-CM | POA: Diagnosis present

## 2016-05-09 DIAGNOSIS — Z79899 Other long term (current) drug therapy: Secondary | ICD-10-CM | POA: Insufficient documentation

## 2016-05-09 DIAGNOSIS — J310 Chronic rhinitis: Secondary | ICD-10-CM

## 2016-05-09 DIAGNOSIS — I10 Essential (primary) hypertension: Secondary | ICD-10-CM | POA: Diagnosis not present

## 2016-05-09 MED ORDER — MOMETASONE FUROATE 50 MCG/ACT NA SUSP: 2.0000 | Freq: Two times a day (BID) | NASAL | 12 refills | Status: DC

## 2016-05-09 MED ORDER — OXYMETAZOLINE HCL 0.05 % NA SOLN
1.0000 | Freq: Once | NASAL | Status: AC
  Administered 2016-05-09: 1 via NASAL
  Filled 2016-05-09: qty 15

## 2016-05-09 MED ORDER — MOMETASONE FUROATE 50 MCG/ACT NA SUSP: 2.0000 | Freq: Two times a day (BID) | NASAL | 0 refills | Status: DC

## 2016-05-09 NOTE — ED Triage Notes (Signed)
Pt has had cough and congestion for several days. Taking mucinex at home and also being tx for a UTI with amoxicillin.

## 2016-05-09 NOTE — ED Notes (Signed)
ED Provider at bedside. 

## 2016-05-09 NOTE — ED Provider Notes (Signed)
[facility] Provider Note   CSN: [id number] Arrival date & time: 05/09/16  1157     History   Chief Complaint Chief Complaint  Patient presents with  . Nasal Congestion    HPI Raven Coleman is a 51 y.o. female.  The history is provided by the patient.  URI   This is a new problem. Episode onset: 3 weeks ago. There has been no fever. Associated symptoms include rhinorrhea. Treatments tried: zyrtec-d. The treatment provided no relief.    Past Medical History:  Diagnosis Date  . Hypertension     There are no active problems to display for this patient.   Past Surgical History:  Procedure Laterality Date  . ABDOMINAL HYSTERECTOMY    . WISDOM TOOTH EXTRACTION      OB History    No data available       Home Medications    Prior to Admission medications   Medication Sig Start Date End Date Taking? Authorizing Provider  amoxicillin (AMOXIL) 500 MG capsule Take 1 capsule (500 mg total) by mouth 3 (three) times daily. 12/08/14   Nicole Pisciotta, PA-C  cetirizine-pseudoephedrine (ZYRTEC-D) 5-120 MG tablet Take 1 tablet by mouth 2 (two) times daily. 02/02/15   Barrett Henle, PA-C  HYDROCHLOROTHIAZIDE PO Take by mouth.    Historical Provider, MD  ibuprofen (ADVIL,MOTRIN) 800 MG tablet Take 1 tablet (800 mg total) by mouth every 8 (eight) hours as needed. 10/20/15   Charlestine Night, PA-C  LISINOPRIL PO Take by mouth.    Historical Provider, MD  Naproxen (NAPROSYN PO) Take by mouth.    Historical Provider, MD  oxyCODONE (ROXICODONE) 5 MG immediate release tablet Take 1 tablet (5 mg total) by mouth every 4 (four) hours as needed. Take 1-2 tablets every 4-6 hours as needed for pain control 12/08/14   Joni Reining Pisciotta, PA-C  penicillin v potassium (VEETID) 500 MG tablet Take 1 tablet (500 mg total) by mouth 3 (three) times daily. 02/06/15   Rolan Bucco, MD  traMADol (ULTRAM) 50 MG tablet Take 1 tablet (50 mg total) by mouth every 6 (six) hours as needed.  02/06/15   Rolan Bucco, MD    Family History No family history on file.  Social History Social History  Substance Use Topics  . Smoking status: Never Smoker  . Smokeless tobacco: Never Used  . Alcohol use No     Allergies   Patient has no known allergies.   Review of Systems Review of Systems  HENT: Positive for rhinorrhea.   All other systems reviewed and are negative.    Physical Exam Updated Vital Signs BP 131/82 (BP Location: Right Arm)   Pulse 85   Temp 98.6 F (37 C) (Oral)   Resp 18   Ht 5\' 3"  (1.6 m)   Wt 208 lb (94.3 kg)   SpO2 97%   BMI 36.85 kg/m   Physical Exam  Constitutional: She is oriented to person, place, and time. She appears well-developed and well-nourished. No distress.  HENT:  Head: Normocephalic.  Nose: Nose normal.  Mouth/Throat: Oropharynx is clear and moist. No oropharyngeal exudate.  Eyes: Conjunctivae are normal.  Neck: Neck supple. No tracheal deviation present.  Cardiovascular: Normal rate, regular rhythm and normal heart sounds.   Pulmonary/Chest: Effort normal and breath sounds normal. No respiratory distress. She has no wheezes. She has no rales.  Abdominal: Soft. She exhibits no distension.  Neurological: She is alert and oriented to person, place, and time.  Skin: Skin is  warm and dry. Capillary refill takes less than 2 seconds.  Psychiatric: She has a normal mood and affect.     ED Treatments / Results  Labs (all labs ordered are listed, but only abnormal results are displayed) Labs Reviewed - No data to display  EKG  EKG Interpretation None       Radiology No results found.  Procedures Procedures (including critical care time)  Medications Ordered in ED Medications - No data to display   Initial Impression / Assessment and Plan / ED Course  I have reviewed the triage vital signs and the nursing notes.  Pertinent labs & imaging results that were available during my care of the patient were reviewed  by me and considered in my medical decision making (see chart for details).  Clinical Course     51 y.o. female presents with runny nose and ongoing cough for almost 3 weeks. No fevers, no adventitious lung sounds, well appearing. Suspect rhinitis d/t recent cold weather with post nasal drip resulting in upper respiratory symptoms. Will supplement antihistamine therapy with afrin for first 3 days prn and nasal steroid. Plan to follow up with PCP as needed and return precautions discussed for worsening or new concerning symptoms.   Final Clinical Impressions(s) / ED Diagnoses   Final diagnoses:  Nonallergic rhinitis    New Prescriptions Discharge Medication List as of 05/09/2016  1:42 PM       Lyndal Pulleyaniel Verlie Hellenbrand, MD 05/09/16 [phone]

## 2017-06-29 IMAGING — CR DG FOOT COMPLETE 3+V*L*
3 series · 3 of 3 positions shown · non-contrast
Comparison: None.

CLINICAL DATA: Pain in her feet for 5 years. She stands at her job.
She was diagnosed with plantar fasciitis by a foot specialist at the
time of the pain. She has had worsened pain and a bruise on the side
of her left foot for a month.

EXAM:
LEFT FOOT - COMPLETE 3+ VIEW

[t foot ap left]
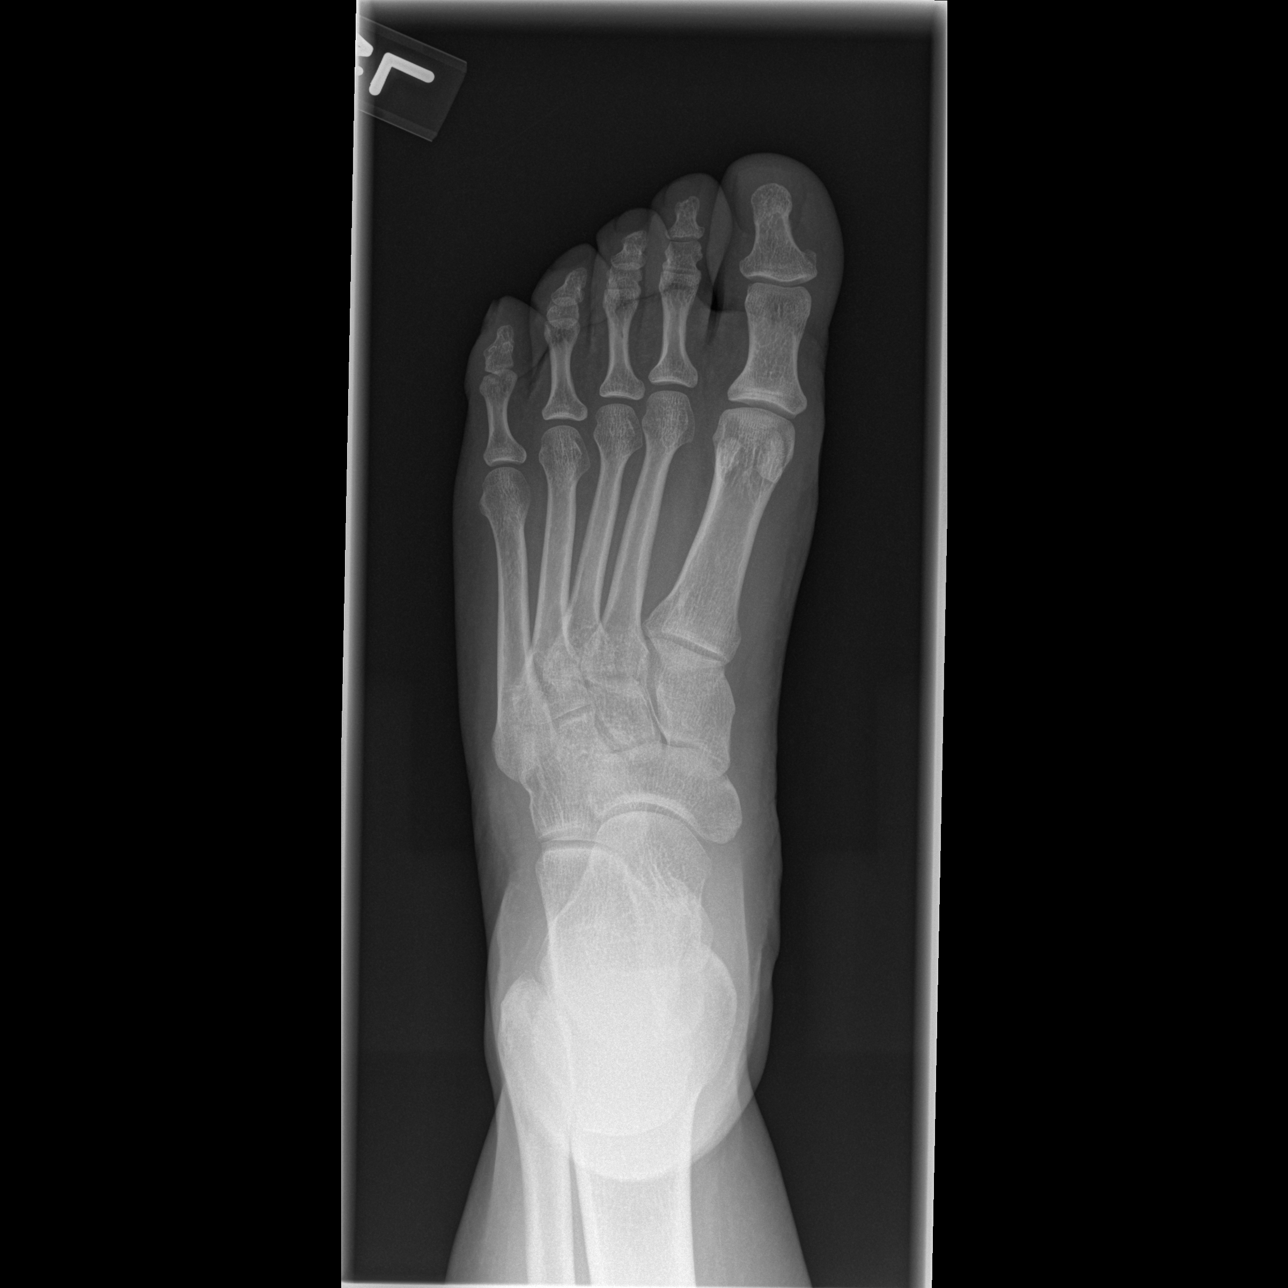

[t foot oblique left]
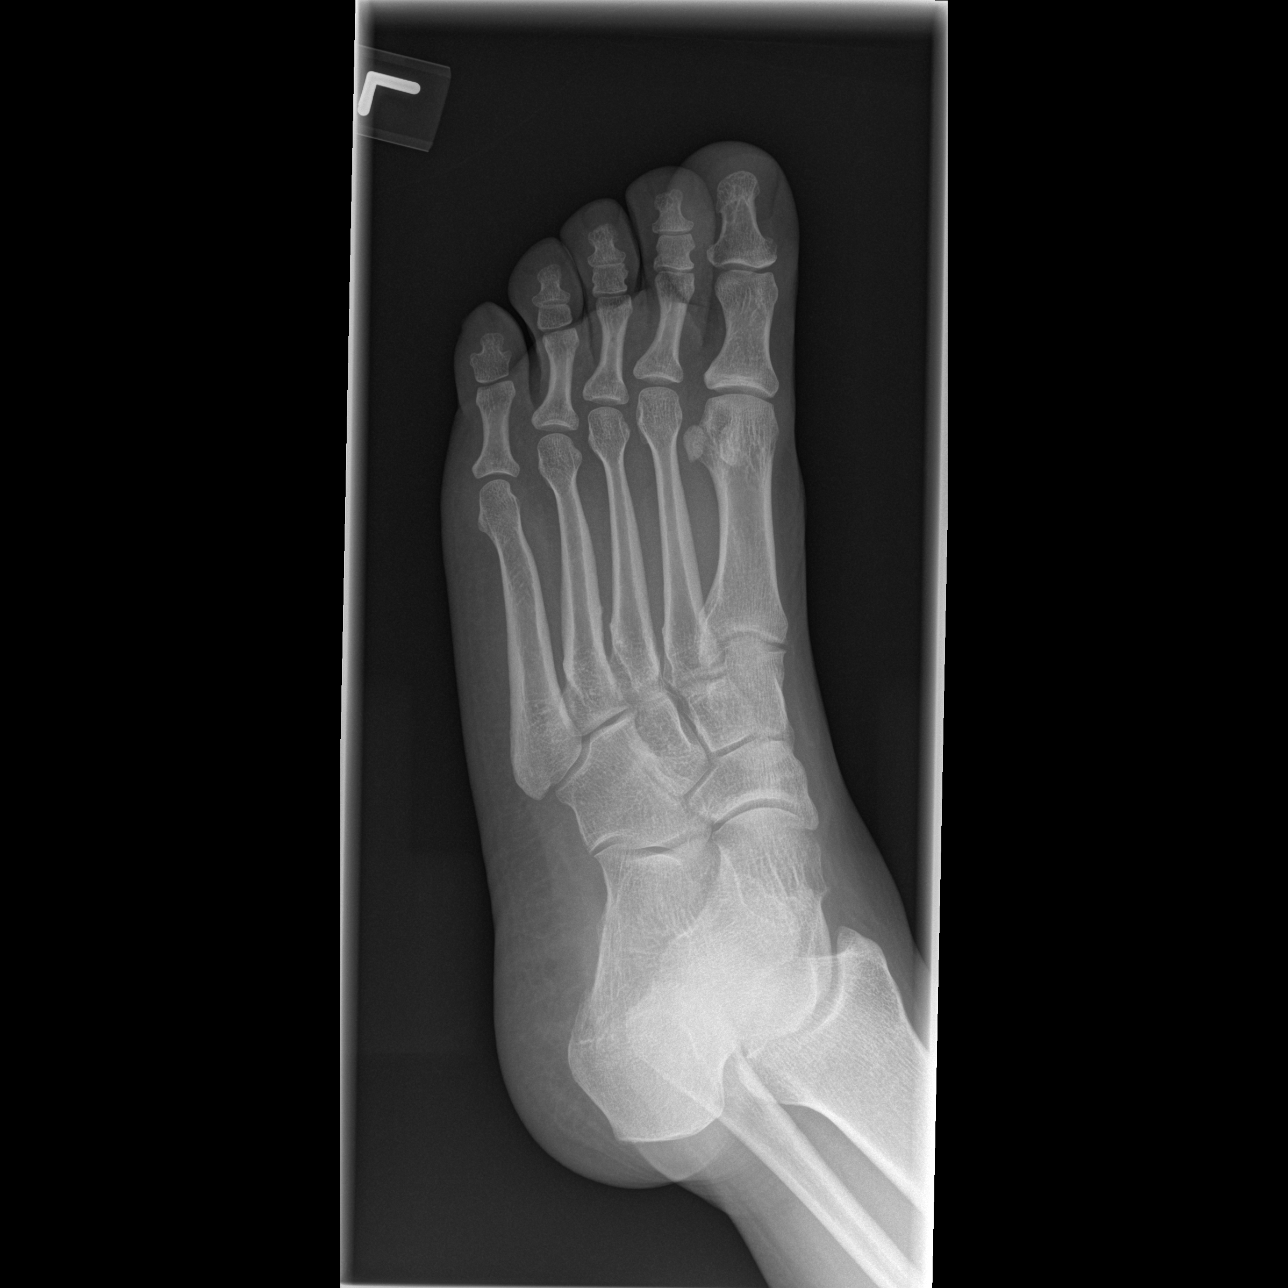

[t foot lat left]
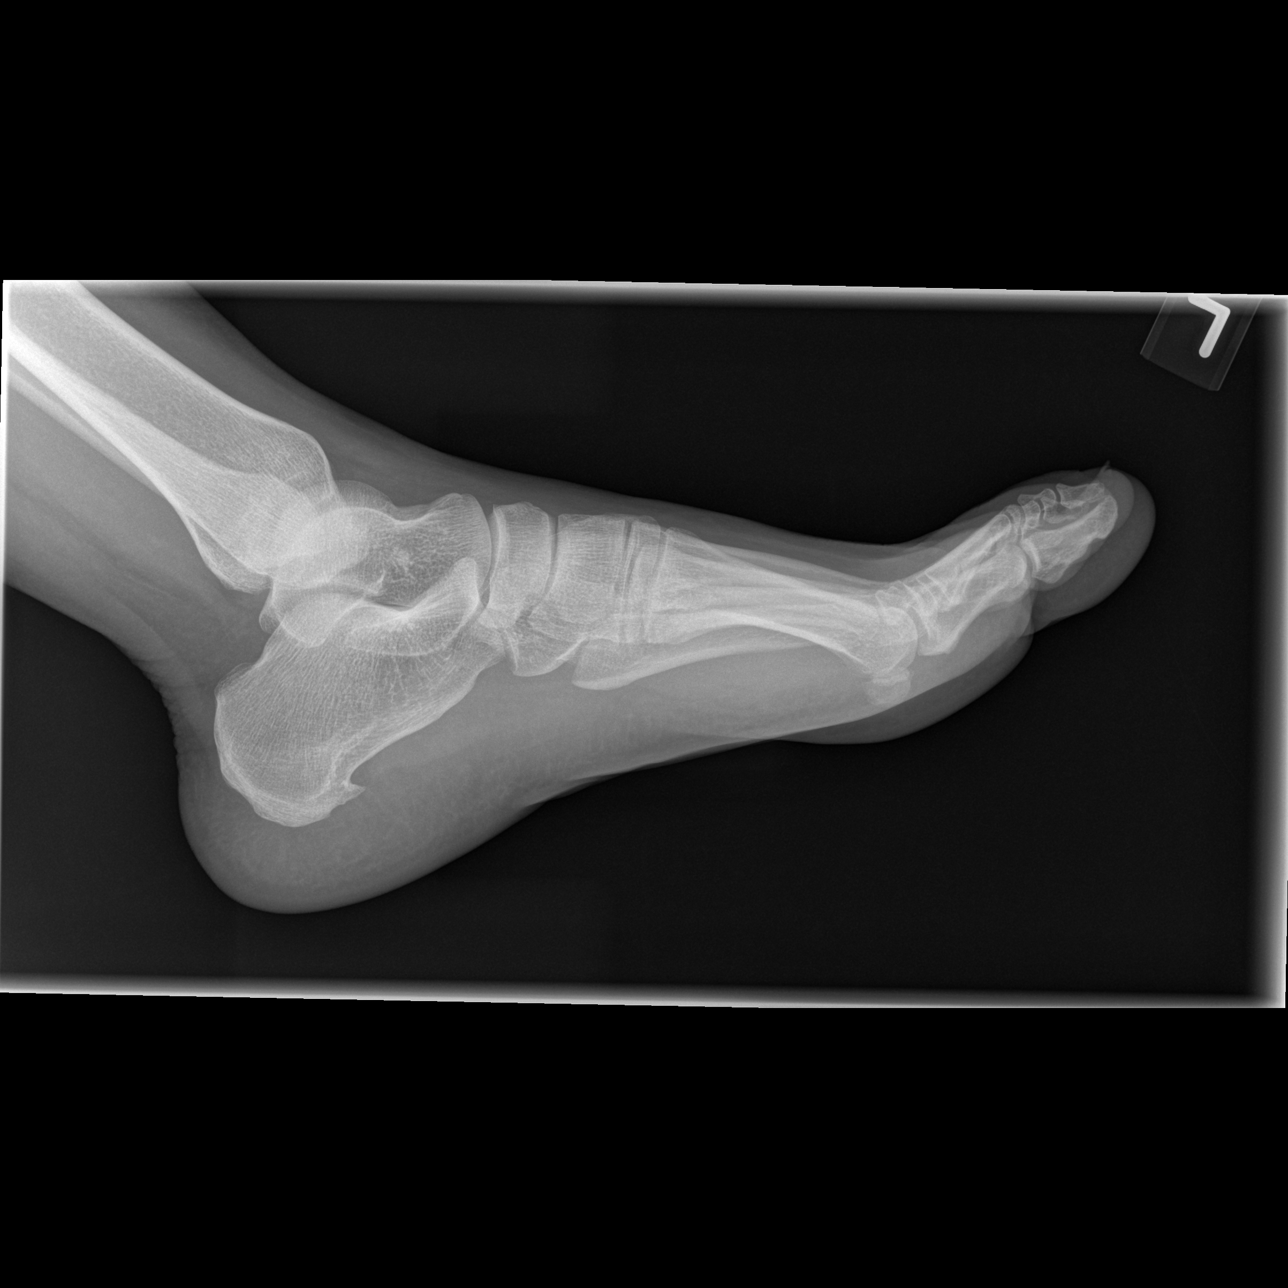

[3 of 3 positions shown; findings below may reference images not displayed]

FINDINGS: There is no evidence of fracture or dislocation. Small calcaneal
spur at the plantar aponeurosis. There is no evidence of arthropathy
or other focal bone abnormality. Soft tissues are unremarkable.
IMPRESSION: Calcaneal spur.  Otherwise negative.

## 2019-07-30 ENCOUNTER — Encounter (HOSPITAL_BASED_OUTPATIENT_CLINIC_OR_DEPARTMENT_OTHER): Payer: Self-pay

## 2019-07-30 ENCOUNTER — Emergency Department (HOSPITAL_BASED_OUTPATIENT_CLINIC_OR_DEPARTMENT_OTHER): Payer: BC Managed Care – PPO

## 2019-07-30 ENCOUNTER — Emergency Department (HOSPITAL_BASED_OUTPATIENT_CLINIC_OR_DEPARTMENT_OTHER)
Admission: EM | Admit: 2019-07-30 | Discharge: 2019-07-30 | Disposition: A | Discharge status: Home / Self Care | Payer: BC Managed Care – PPO | Attending: Emergency Medicine | Admitting: Emergency Medicine

## 2019-07-30 ENCOUNTER — Other Ambulatory Visit: Payer: Self-pay

## 2019-07-30 DIAGNOSIS — R0789 Other chest pain: Secondary | ICD-10-CM | POA: Diagnosis present

## 2019-07-30 DIAGNOSIS — M79605 Pain in left leg: Secondary | ICD-10-CM | POA: Insufficient documentation

## 2019-07-30 DIAGNOSIS — I10 Essential (primary) hypertension: Secondary | ICD-10-CM | POA: Insufficient documentation

## 2019-07-30 DIAGNOSIS — R110 Nausea: Secondary | ICD-10-CM | POA: Diagnosis not present

## 2019-07-30 DIAGNOSIS — R079 Chest pain, unspecified: Secondary | ICD-10-CM

## 2019-07-30 DIAGNOSIS — R2242 Localized swelling, mass and lump, left lower limb: Secondary | ICD-10-CM | POA: Diagnosis not present

## 2019-07-30 LAB — TROPONIN I (HIGH SENSITIVITY)
Troponin I (High Sensitivity): 2 ng/L (ref <18)
Troponin I (High Sensitivity): 3 ng/L (ref <18)

## 2019-07-30 LAB — BASIC METABOLIC PANEL
Anion gap: 10 (ref 5–15)
BUN: 15 mg/dL (ref 6–20)
CO2: 25 mmol/L (ref 22–32)
Calcium: 9.6 mg/dL (ref 8.9–10.3)
Chloride: 103 mmol/L (ref 98–111)
Creatinine, Ser: 0.75 mg/dL (ref 0.44–1.00)
GFR calc Af Amer: >60 mL/min (ref >60)
GFR calc non Af Amer: >60 mL/min (ref >60)
Glucose, Bld: 90 mg/dL (ref 70–99)
Potassium: 3.5 mmol/L (ref 3.5–5.1)
Sodium: 138 mmol/L (ref 135–145)

## 2019-07-30 LAB — CBC
HCT: 42.1 % (ref 36.0–46.0)
Hemoglobin: 13.6 g/dL (ref 12.0–15.0)
MCH: 28.7 pg (ref 26.0–34.0)
MCHC: 32.3 g/dL (ref 30.0–36.0)
MCV: 88.8 fL (ref 80.0–100.0)
Platelets: 250 10*3/uL (ref 150–400)
RBC: 4.74 MIL/uL (ref 3.87–5.11)
RDW: 13.3 % (ref 11.5–15.5)
WBC: 6.2 10*3/uL (ref 4.0–10.5)
nRBC: 0 % (ref 0.0–0.2)

## 2019-07-30 LAB — D-DIMER, QUANTITATIVE: D-Dimer, Quant: 0.42 ug/mL-FEU (ref 0.00–0.50)

## 2019-07-30 MED ORDER — ASPIRIN 81 MG PO CHEW
324.0000 mg | CHEWABLE_TABLET | Freq: Once | ORAL | Status: AC
  Administered 2019-07-30: 17:00:00 324 mg via ORAL
  Filled 2019-07-30: qty 4

## 2019-07-30 NOTE — Discharge Instructions (Addendum)
You were evaluated in the Emergency Department and after careful evaluation, we did not find any emergent condition requiring admission or further testing in the hospital. ° °Your exam/testing today was overall reassuring. ° °Please return to the Emergency Department if you experience any worsening of your condition.  We encourage you to follow up with a primary care provider.  Thank you for allowing us to be a part of your care. ° °

## 2019-07-30 NOTE — ED Notes (Signed)
Pt on monitor 

## 2019-07-30 NOTE — ED Triage Notes (Signed)
Pt arrives to ED with c/o CP all day, states it started in the middle and moved to the left. Pt reports checking BP at work states it was 150/80. Pt has had some SOB, some nausea, denies dizziness or lightheadedness.

## 2019-07-30 NOTE — ED Provider Notes (Signed)
[facility] Emergency Department Provider Note MRN:  [id number]  Arrival date & time: 07/30/19     Chief Complaint   Chest Pain   History of Present Illness   Raven Coleman is a 54 y.o. year-old female with a history of hypertension presenting to the ED with chief complaint of chest pain.  Central chest pain with occasional radiation to the left side of the chest, started 9 AM this morning while at work.  Explains that she works at a store loading shelves all day, has had pulled muscles in the chest before but this feels different.  Associated with mild nausea, described as sharp, worse with exertion.  Also endorsing left leg pain and swelling for the past 1 to 2 months.  Denies shortness of breath.  Review of Systems  A complete 10 system review of systems was obtained and all systems are negative except as noted in the HPI and PMH.   Patient's Health History    Past Medical History:  Diagnosis Date  . Hypertension     Past Surgical History:  Procedure Laterality Date  . ABDOMINAL HYSTERECTOMY    . WISDOM TOOTH EXTRACTION      No family history on file.  Social History   Socioeconomic History  . Marital status: Legally Separated    Spouse name: Not on file  . Number of children: Not on file  . Years of education: Not on file  . Highest education level: Not on file  Occupational History  . Not on file  Tobacco Use  . Smoking status: Never Smoker  . Smokeless tobacco: Never Used  Substance and Sexual Activity  . Alcohol use: No  . Drug use: No  . Sexual activity: Not on file  Other Topics Concern  . Not on file  Social History Narrative  . Not on file   Social Determinants of Health   Financial Resource Strain:   . Difficulty of Paying Living Expenses:   Food Insecurity:   . Worried About Charity fundraiser in the Last Year:   . Arboriculturist in the Last Year:   Transportation Needs:   . Film/video editor (Medical):     Marland Kitchen Lack of Transportation (Non-Medical):   Physical Activity:   . Days of Exercise per Week:   . Minutes of Exercise per Session:   Stress:   . Feeling of Stress :   Social Connections:   . Frequency of Communication with Friends and Family:   . Frequency of Social Gatherings with Friends and Family:   . Attends Religious Services:   . Active Member of Clubs or Organizations:   . Attends Archivist Meetings:   Marland Kitchen Marital Status:   Intimate Partner Violence:   . Fear of Current or Ex-Partner:   . Emotionally Abused:   Marland Kitchen Physically Abused:   . Sexually Abused:      Physical Exam   Vitals:   07/30/19 1700 07/30/19 1800  BP: 134/75 (!) 147/92  Pulse: 73 77  Resp: 19 (!) 21  Temp:    SpO2: 96% 97%    CONSTITUTIONAL: Well-appearing, NAD NEURO:  Alert and oriented x 3, no focal deficits EYES:  eyes equal and reactive ENT/NECK:  no LAD, no JVD CARDIO: Regular rate, well-perfused, normal S1 and S2 PULM:  CTAB no wheezing or rhonchi GI/GU:  normal bowel sounds, non-distended, non-tender MSK/SPINE:  No gross deformities, no edema SKIN:  no rash, atraumatic PSYCH:  Appropriate speech and behavior  *Additional and/or pertinent findings included in MDM below  Diagnostic and Interventional Summary    EKG Interpretation  Date/Time:  Monday July 30 2019 15:40:52 EDT Ventricular Rate:  69 PR Interval:  168 QRS Duration: 76 QT Interval:  370 QTC Calculation: 396 R Axis:   54 Text Interpretation: Normal sinus rhythm Normal ECG No previous ECGs available Confirmed by Kennis Carina [phone]) on 07/30/2019 4:25:14 PM      Labs Reviewed  BASIC METABOLIC PANEL  CBC  D-DIMER, QUANTITATIVE (NOT AT [facility])  TROPONIN I (HIGH SENSITIVITY)  TROPONIN I (HIGH SENSITIVITY)    US Venous Img Lower Unilateral Left  Final Result    DG Chest 2 View  Final Result      Medications  aspirin chewable tablet 324 mg (324 mg Oral Given 07/30/19 1648)     Procedures  /  Critical  Care Procedures  ED Course and Medical Decision Making  I have reviewed the triage vital signs, the nursing notes, and pertinent available records from the EMR.  Pertinent labs & imaging results that were available during my care of the patient were reviewed by me and considered in my medical decision making (see below for details).     Atypical chest pain that is sharp, not worse with deep breaths, question worse with exertion.  Very little risk factors for cardiac disease.  Suspicious the patient has had leg pain and swelling, considering VTE, will screen with D-dimer, ultrasound of the leg.  Troponin negative x2, D-dimer negative, ultrasound is negative.  No evidence to suggest ACS or pulmonary embolism, suspect benign etiology of patient's pain, appropriate for discharge.  Elmer Sow. Pilar Plate, MD [facility] Emergency Medicine [facility] [email or web address]  Final Clinical Impressions(s) / ED Diagnoses     ICD-10-CM   1. Chest pain, unspecified type  R07.9     ED Discharge Orders    None       Discharge Instructions Discussed with and Provided to Patient:     Discharge Instructions     You were evaluated in the Emergency Department and after careful evaluation, we did not find any emergent condition requiring admission or further testing in the hospital.  Your exam/testing today was overall reassuring.  Please return to the Emergency Department if you experience any worsening of your condition.  We encourage you to follow up with a primary care provider.  Thank you for allowing [place] to be a part of your care.        Sabas Sous, MD 07/30/19 Barry Brunner

## 2020-10-25 ENCOUNTER — Encounter (HOSPITAL_BASED_OUTPATIENT_CLINIC_OR_DEPARTMENT_OTHER): Payer: Self-pay | Admitting: Emergency Medicine

## 2020-10-25 ENCOUNTER — Emergency Department (HOSPITAL_BASED_OUTPATIENT_CLINIC_OR_DEPARTMENT_OTHER): Payer: 59

## 2020-10-25 ENCOUNTER — Emergency Department (HOSPITAL_BASED_OUTPATIENT_CLINIC_OR_DEPARTMENT_OTHER)
Admission: EM | Admit: 2020-10-25 | Discharge: 2020-10-25 | Disposition: A | Discharge status: Home / Self Care | Payer: 59 | Attending: Emergency Medicine | Admitting: Emergency Medicine

## 2020-10-25 ENCOUNTER — Other Ambulatory Visit: Payer: Self-pay

## 2020-10-25 DIAGNOSIS — Z79899 Other long term (current) drug therapy: Secondary | ICD-10-CM | POA: Insufficient documentation

## 2020-10-25 DIAGNOSIS — M25511 Pain in right shoulder: Secondary | ICD-10-CM | POA: Diagnosis not present

## 2020-10-25 DIAGNOSIS — I10 Essential (primary) hypertension: Secondary | ICD-10-CM | POA: Insufficient documentation

## 2020-10-25 MED ORDER — MELOXICAM 7.5 MG PO TABS: 7.5000 mg | ORAL_TABLET | Freq: Every day | ORAL | 0 refills | Status: AC

## 2020-10-25 MED ORDER — CYCLOBENZAPRINE HCL 10 MG PO TABS: 10.0000 mg | ORAL_TABLET | Freq: Every day | ORAL | 0 refills | Status: AC

## 2020-10-25 NOTE — ED Provider Notes (Signed)
[facility] EMERGENCY DEPARTMENT Provider Note   CSN: [id number] Arrival date & time: 10/25/20  1844     History Chief Complaint  Patient presents with   Shoulder Pain    Raven Coleman is a 55 y.o. female.  Patient presents the emergency department for evaluation of ongoing right shoulder pain.  Patient states that she injured her shoulder about 4 years ago.  She does exercises, applies heat, takes muscle relaxers for symptoms.  Typically these will help, however they have been less effective over the past several months.  She "tolerates" the pain during the day but feels that is worse at night.  Pain is worse with movement of the arm.  No distal numbness or tingling.  She has some soreness that radiates into the lateral portion of the neck.      Past Medical History:  Diagnosis Date   Hypertension     There are no problems to display for this patient.   Past Surgical History:  Procedure Laterality Date   ABDOMINAL HYSTERECTOMY     WISDOM TOOTH EXTRACTION       OB History   No obstetric history on file.     No family history on file.  Social History   Tobacco Use   Smoking status: Never   Smokeless tobacco: Never  Substance Use Topics   Alcohol use: No   Drug use: No    Home Medications Prior to Admission medications   Medication Sig Start Date End Date Taking? Authorizing Provider  amoxicillin (AMOXIL) 500 MG capsule Take 1 capsule (500 mg total) by mouth 3 (three) times daily. 12/08/14   Pisciotta, Joni Reining, PA-C  cetirizine-pseudoephedrine (ZYRTEC-D) 5-120 MG tablet Take 1 tablet by mouth 2 (two) times daily. 02/02/15   Barrett Henle, PA-C  HYDROCHLOROTHIAZIDE PO Take by mouth.    [provider]  ibuprofen (ADVIL,MOTRIN) 800 MG tablet Take 1 tablet (800 mg total) by mouth every 8 (eight) hours as needed. 10/20/15   Lawyer, Cristal Deer, PA-C  LISINOPRIL PO Take by mouth.    [provider]  mometasone (NASONEX) 50  MCG/ACT nasal spray Place 2 sprays into the nose 2 (two) times daily. 05/09/16   Lyndal Pulley, MD  Naproxen (NAPROSYN PO) Take by mouth.    [provider]  oxyCODONE (ROXICODONE) 5 MG immediate release tablet Take 1 tablet (5 mg total) by mouth every 4 (four) hours as needed. Take 1-2 tablets every 4-6 hours as needed for pain control 12/08/14   Pisciotta, Joni Reining, PA-C  penicillin v potassium (VEETID) 500 MG tablet Take 1 tablet (500 mg total) by mouth 3 (three) times daily. 02/06/15   Rolan Bucco, MD  traMADol (ULTRAM) 50 MG tablet Take 1 tablet (50 mg total) by mouth every 6 (six) hours as needed. 02/06/15   Rolan Bucco, MD    Allergies    Patient has no known allergies.  Review of Systems   Review of Systems  Constitutional:  Negative for activity change.  Musculoskeletal:  Positive for arthralgias. Negative for back pain, joint swelling and neck pain.  Skin:  Negative for wound.  Neurological:  Negative for weakness and numbness.   Physical Exam Updated Vital Signs BP (!) 143/87 (BP Location: Left Arm)   Pulse 89   Temp 98.7 F (37.1 C) (Oral)   Resp 18   Ht 5' (1.524 m)   Wt 99.8 kg   SpO2 100%   BMI 42.97 kg/m   Physical Exam Vitals and nursing note  reviewed.  Constitutional:      Appearance: She is well-developed.  HENT:     Head: Normocephalic and atraumatic.  Eyes:     Pupils: Pupils are equal, round, and reactive to light.  Cardiovascular:     Pulses: Normal pulses. No decreased pulses.  Musculoskeletal:        General: Tenderness present.     Right shoulder: Tenderness (Anterior shoulder, proximal humerus) present. No deformity. Decreased range of motion.     Cervical back: Normal range of motion and neck supple.     Comments: Patient with increasing pain in the anterior shoulder with external rotation of the shoulder.  She has decreased range of motion with this movement.  Skin:    General: Skin is warm and dry.  Neurological:     Mental Status:  She is alert.     Sensory: No sensory deficit.     Comments: Motor, sensation, and vascular distal to the injury is fully intact.   Psychiatric:        Mood and Affect: Mood normal.    ED Results / Procedures / Treatments   Labs (all labs ordered are listed, but only abnormal results are displayed) Labs Reviewed - No data to display  EKG None  Radiology No results found.  Procedures Procedures   Medications Ordered in ED Medications - No data to display  ED Course  I have reviewed the triage vital signs and the nursing notes.  Pertinent labs & imaging results that were available during my care of the patient were reviewed by me and considered in my medical decision making (see chart for details).  Patient seen and examined. Work-up initiated.   Vital signs reviewed and are as follows: BP (!) 143/87 (BP Location: Left Arm)   Pulse 89   Temp 98.7 F (37.1 C) (Oral)   Resp 18   Ht 5' (1.524 m)   Wt 99.8 kg   SpO2 100%   BMI 42.97 kg/m   X-ray reviewed.  Patient updated on results.  Plan: Meloxicam, Flexeril.   Patient counseled on proper use of muscle relaxant medication.  They were told not to drink alcohol, drive any vehicle, or do any dangerous activities while taking this medication.  Patient verbalized understanding.  Referral to sports med.     MDM Rules/Calculators/A&P                          Shoulder pain, chronic, worsening.  Upper extremity is neurovascularly intact.  X-rays negative.  She would benefit from sports medicine follow-up.  Treatment plan as above.   Final Clinical Impression(s) / ED Diagnoses Final diagnoses:  Acute pain of right shoulder    Rx / DC Orders ED Discharge Orders          Ordered    meloxicam (MOBIC) 7.5 MG tablet  Daily        10/25/20 2018    cyclobenzaprine (FLEXERIL) 10 MG tablet  Daily at bedtime        10/25/20 2018             Renne Crigler, PA-C 10/25/20 2021    Little, Ambrose Finland,  MD 10/25/20 [phone]

## 2020-10-25 NOTE — ED Triage Notes (Signed)
Reports hx of right shoulder injury four years ago.  Pain getting worse the last few months.

## 2020-10-25 NOTE — ED Notes (Signed)
Patient transported to X-ray 

## 2020-10-25 NOTE — Discharge Instructions (Signed)
Please read and follow all provided instructions.  Your diagnoses today include:  1. Acute pain of right shoulder     Tests performed today include: An x-ray of the affected area - does NOT show any broken bones Vital signs. See below for your results today.   Medications prescribed:  Meloxicam - anti-inflammatory pain medication  You have been prescribed an anti-inflammatory medication or NSAID. Take with food. Do not take aspirin, ibuprofen, or naproxen if taking this medication. Take smallest effective dose for the shortest duration needed for your pain. Stop taking if you experience stomach pain or vomiting.   Flexeril (cyclobenzaprine) - muscle relaxer medication  DO NOT drive or perform any activities that require you to be awake and alert because this medicine can make you drowsy.   Take any prescribed medications only as directed.  Home care instructions:  Follow any educational materials contained in this packet Follow R.I.C.E. Protocol: R - rest your injury  I  - use ice on injury without applying directly to skin C - compress injury with bandage or splint E - elevate the injury as much as possible  Follow-up instructions: Please follow-up with your primary care provider or the provided orthopedic physician (bone specialist) if you continue to have significant pain in 1 week.   Return instructions:  Please return if your fingers are numb or tingling, appear gray or blue, or you have severe pain (also elevate the arm and loosen splint or wrap if you were given one) Please return to the Emergency Department if you experience worsening symptoms.  Please return if you have any other emergent concerns.  Additional Information:  Your vital signs today were: BP (!) 143/87 (BP Location: Left Arm)   Pulse 89   Temp 98.7 F (37.1 C) (Oral)   Resp 18   Ht 5' (1.524 m)   Wt 99.8 kg   SpO2 100%   BMI 42.97 kg/m  If your blood pressure (BP) was elevated above 135/85 this  visit, please have this repeated by your doctor within one month. --------------

## 2020-11-18 ENCOUNTER — Telehealth (HOSPITAL_BASED_OUTPATIENT_CLINIC_OR_DEPARTMENT_OTHER): Payer: Self-pay | Admitting: Emergency Medicine

## 2021-04-08 IMAGING — US US EXTREM LOW VENOUS*L*
1 series · 13 of 24 positions shown · non-contrast
Comparison: None.

CLINICAL DATA: 54-year-old female with left lower extremity
swelling



[Series 1: us extrem low venous*left* · 13 of 32 slices shown]
[im 1/32]
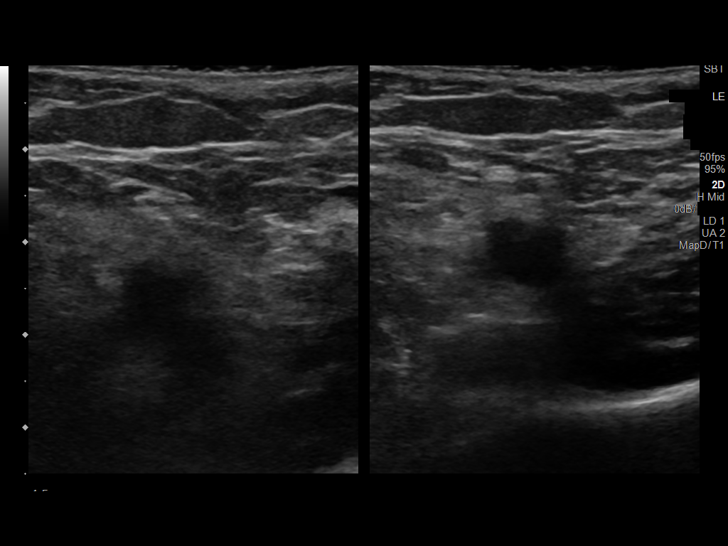
[im 3/32]
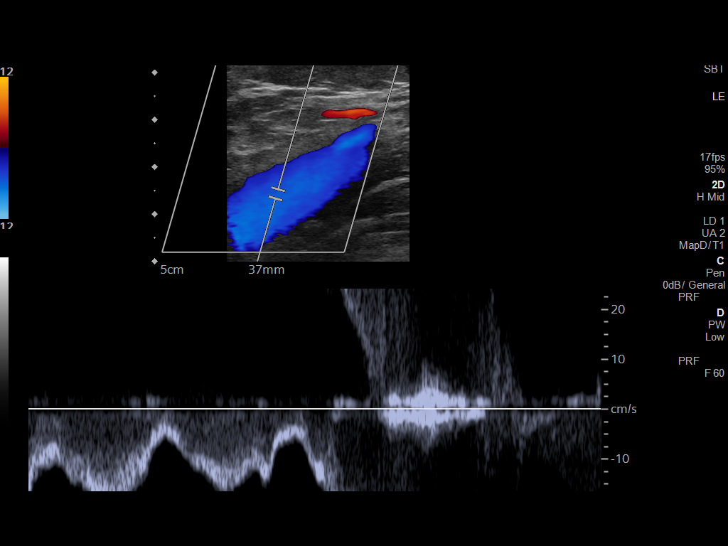
[im 6/32]
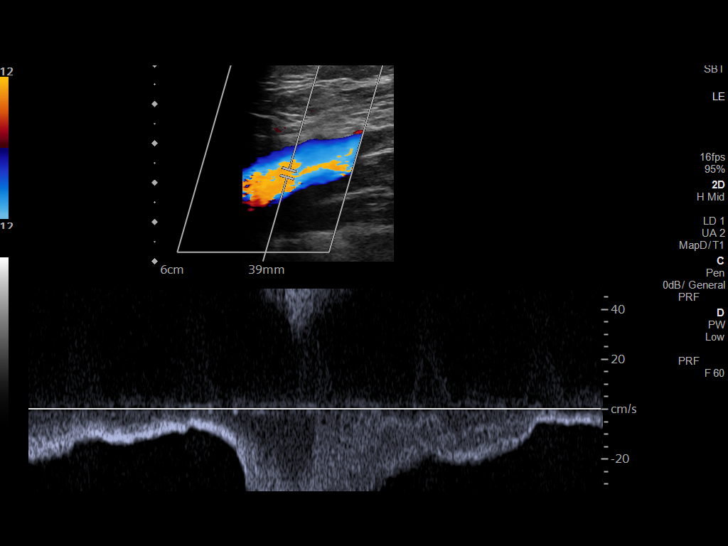
[im 9/32]
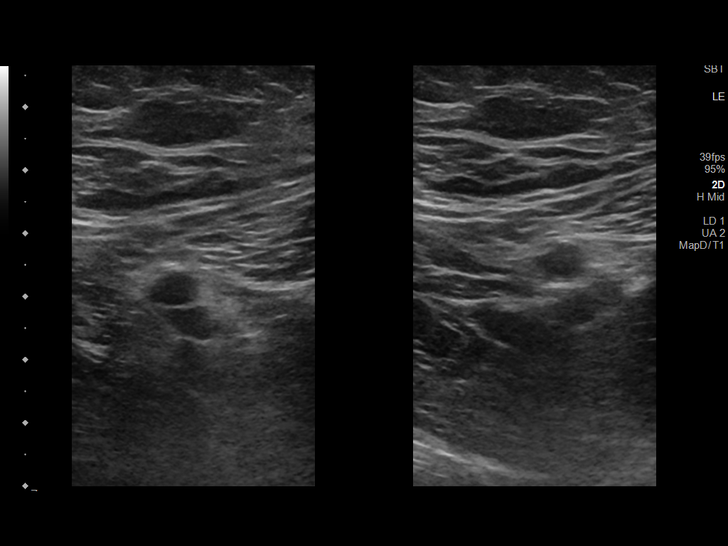
[im 11/32]
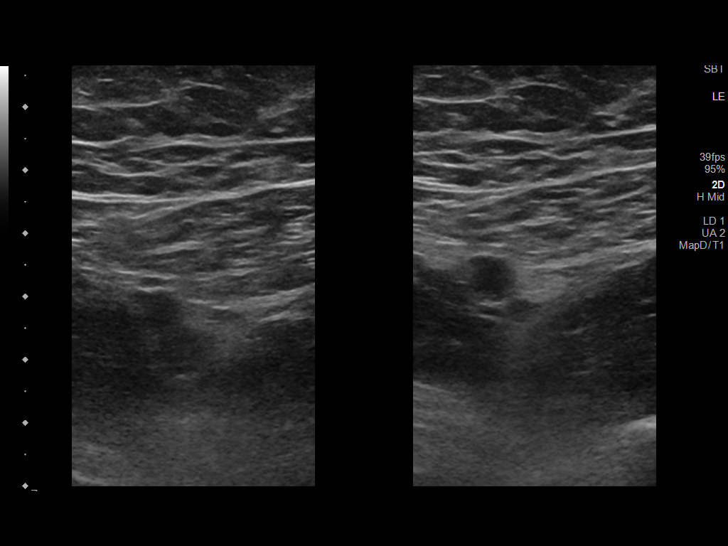
[im 14/32]
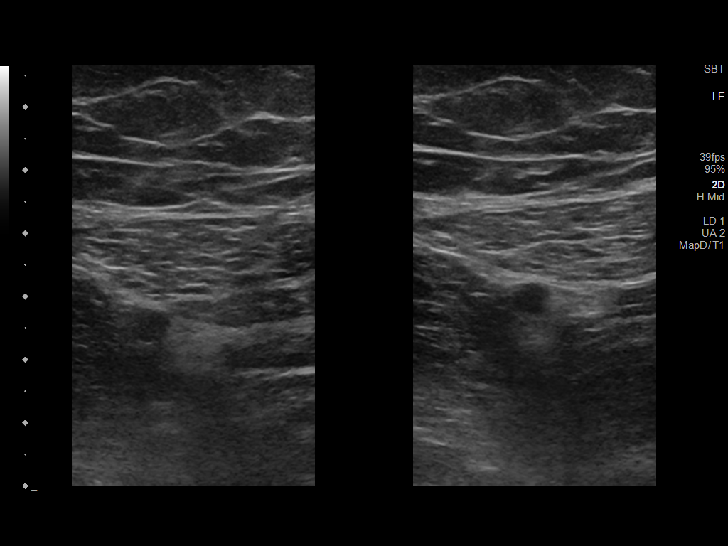
[im 17/32]
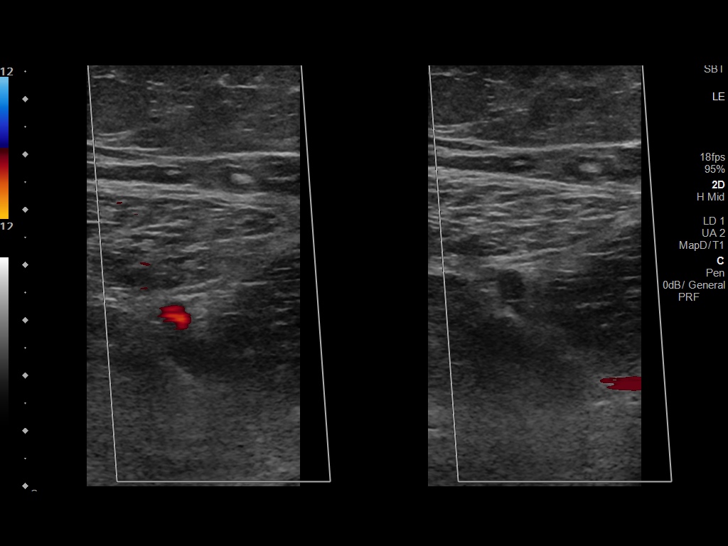
[im 18/32]
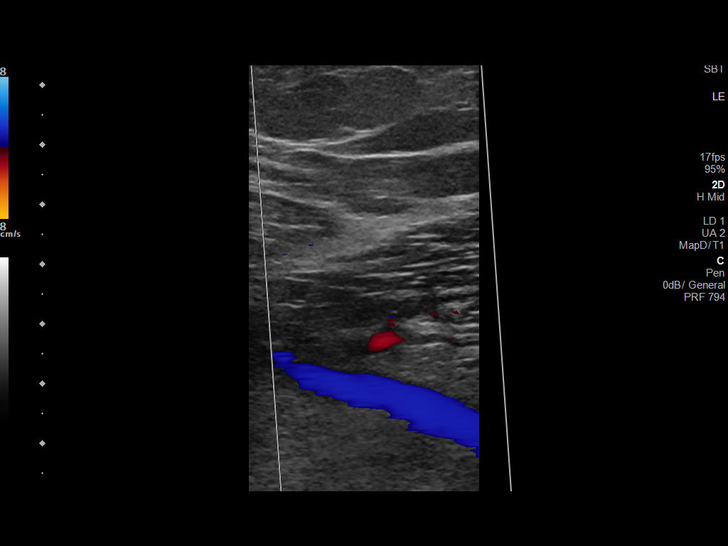
[im 21/32]
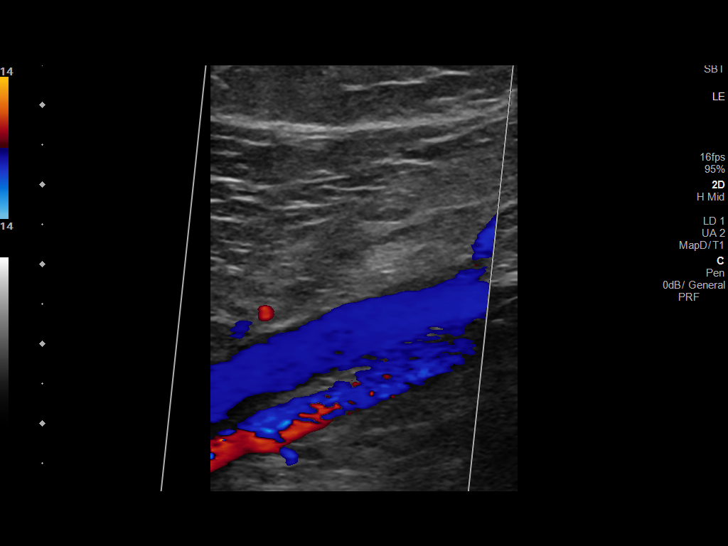
[im 23/32]
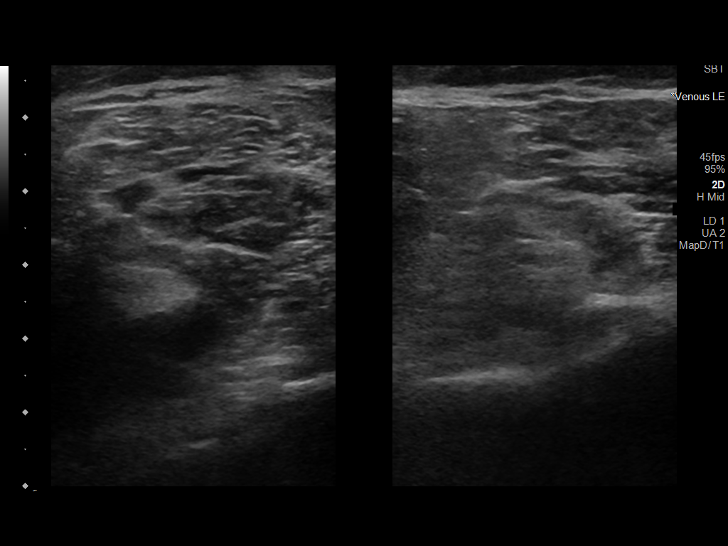
[im 26/32]
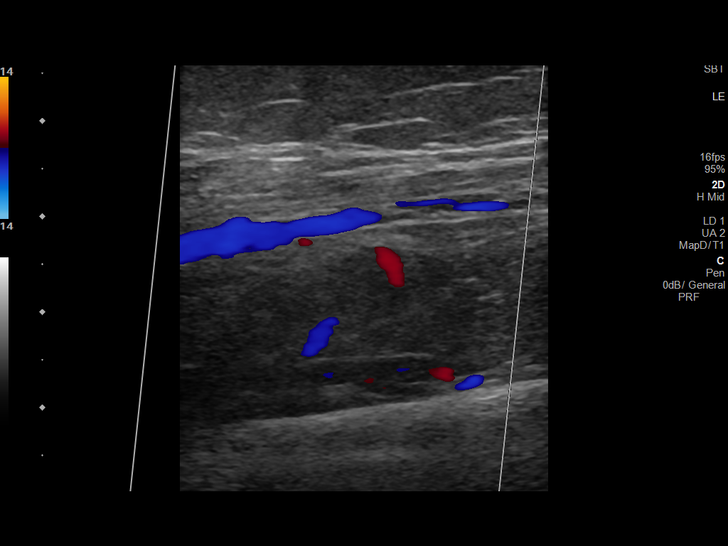
[im 29/32]
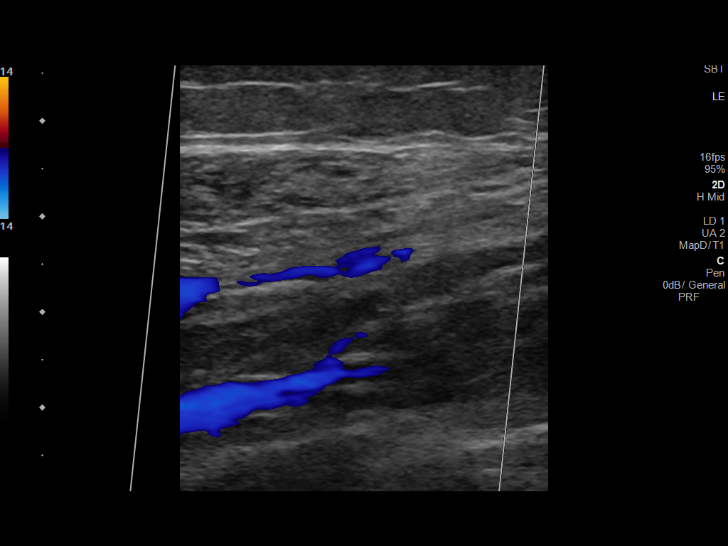
[im 32/32]
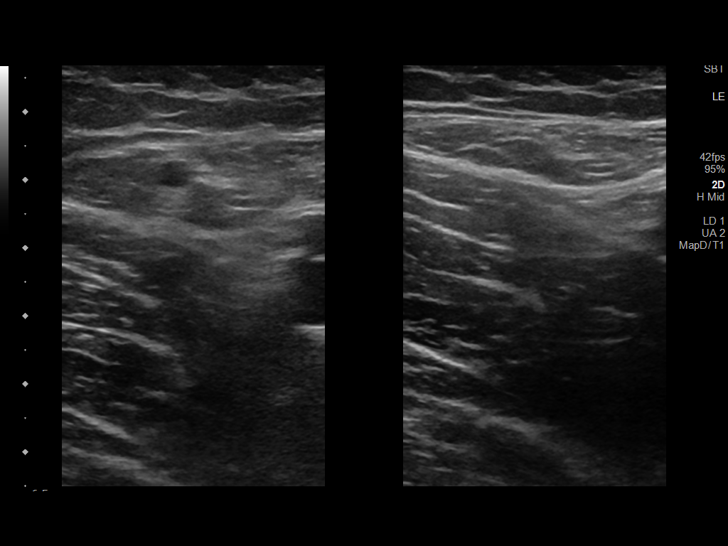

[13 of 24 positions shown; findings below may reference images not displayed]

FINDINGS: Contralateral Common Femoral Vein: Respiratory phasicity is normal
and symmetric with the symptomatic side. No evidence of thrombus.
Normal compressibility.

Common Femoral Vein: No evidence of thrombus. Normal
compressibility, respiratory phasicity and response to augmentation.

Saphenofemoral Junction: No evidence of thrombus. Normal
compressibility and flow on color Doppler imaging.

Profunda Femoral Vein: No evidence of thrombus. Normal
compressibility and flow on color Doppler imaging.

Femoral Vein: No evidence of thrombus. Normal compressibility,
respiratory phasicity and response to augmentation.

Popliteal Vein: No evidence of thrombus. Normal compressibility,
respiratory phasicity and response to augmentation.

Calf Veins: No evidence of thrombus. Normal compressibility and flow
on color Doppler imaging.

Superficial Great Saphenous Vein: No evidence of thrombus. Normal
compressibility.

Venous Reflux:  None.

Other Findings:  None.
IMPRESSION: No evidence of deep venous thrombosis.

## 2023-02-19 ENCOUNTER — Other Ambulatory Visit: Payer: Self-pay

## 2023-02-19 ENCOUNTER — Encounter (HOSPITAL_BASED_OUTPATIENT_CLINIC_OR_DEPARTMENT_OTHER): Payer: Self-pay | Admitting: Emergency Medicine

## 2023-02-19 ENCOUNTER — Emergency Department (HOSPITAL_BASED_OUTPATIENT_CLINIC_OR_DEPARTMENT_OTHER)
Admission: EM | Admit: 2023-02-19 | Discharge: 2023-02-19 | Disposition: A | Discharge status: Home / Self Care | Payer: 59 | Attending: Emergency Medicine | Admitting: Emergency Medicine

## 2023-02-19 DIAGNOSIS — B9789 Other viral agents as the cause of diseases classified elsewhere: Secondary | ICD-10-CM | POA: Diagnosis not present

## 2023-02-19 DIAGNOSIS — Z1152 Encounter for screening for COVID-19: Secondary | ICD-10-CM | POA: Diagnosis not present

## 2023-02-19 DIAGNOSIS — J029 Acute pharyngitis, unspecified: Secondary | ICD-10-CM

## 2023-02-19 DIAGNOSIS — J028 Acute pharyngitis due to other specified organisms: Secondary | ICD-10-CM | POA: Insufficient documentation

## 2023-02-19 LAB — SARS CORONAVIRUS 2 BY RT PCR: SARS Coronavirus 2 by RT PCR: NEGATIVE

## 2023-02-19 LAB — GROUP A STREP BY PCR: Group A Strep by PCR: NOT DETECTED

## 2023-02-19 NOTE — ED Provider Notes (Signed)
[place] [facility] Provider Note   CSN: [id number] Arrival date & time: 02/19/23  1414     History Chief Complaint  Patient presents with   Sore Throat    HPI The patient presents with a chief complaint of sore throat and ear pain, which started yesterday The pain worsened last night, and the patient attempted to alleviate the symptoms by taking sinus medicine, pain medication, and using allergy medications. The patient denies any rashes or other symptoms.   Patient's recorded medical, surgical, social, medication list and allergies were reviewed in the Snapshot window as part of the initial history.   Review of Systems   Review of Systems  Constitutional:  Negative for chills and fever.  HENT:  Positive for congestion and sore throat. Negative for ear pain.   Eyes:  Negative for pain and visual disturbance.  Respiratory:  Negative for cough and shortness of breath.   Cardiovascular:  Negative for chest pain and palpitations.  Gastrointestinal:  Negative for abdominal pain and vomiting.  Genitourinary:  Negative for dysuria and hematuria.  Musculoskeletal:  Negative for arthralgias and back pain.  Skin:  Negative for color change and rash.  Neurological:  Negative for seizures and syncope.  All other systems reviewed and are negative.   Physical Exam Updated Vital Signs BP 128/83 (BP Location: Right Arm)   Pulse 65   Temp 98 F (36.7 C) (Oral)   Resp 20   Ht 5' (1.524 m)   Wt 99.8 kg   SpO2 99%   BMI 42.97 kg/m  Physical Exam Vitals and nursing note reviewed.  Constitutional:      General: She is not in acute distress.    Appearance: She is well-developed.  HENT:     Head: Normocephalic and atraumatic.  Eyes:     Conjunctiva/sclera: Conjunctivae normal.  Cardiovascular:     Rate and Rhythm: Normal rate and regular rhythm.     Heart sounds: No murmur heard. Pulmonary:     Effort: Pulmonary effort is normal. No  respiratory distress.     Breath sounds: Normal breath sounds.  Abdominal:     General: There is no distension.     Palpations: Abdomen is soft.     Tenderness: There is no abdominal tenderness. There is no right CVA tenderness or left CVA tenderness.  Musculoskeletal:        General: No swelling or tenderness. Normal range of motion.     Cervical back: Neck supple.  Skin:    General: Skin is warm and dry.  Neurological:     General: No focal deficit present.     Mental Status: She is alert and oriented to person, place, and time. Mental status is at baseline.     Cranial Nerves: No cranial nerve deficit.      ED Course/ Medical Decision Making/ A&P    Procedures Procedures   Medications Ordered in ED Medications - No data to display  Medical Decision Making:   Medical Decision Making:   Alyssa Majcher is a 57 y.o. female who presented to the ED today with subjective fever, cough, congestion detailed above.    Patient placed on continuous vitals and telemetry monitoring while in ED which was reviewed periodically.   Complete initial physical exam performed, notably the patient  was hemodynamically stable in no acute distress.  Posterior oropharynx illuminated and without obvious swelling or deformity.  Patient is without neck stiffness.    Reviewed and confirmed  nursing documentation for past medical history, family history, social history.    Initial Assessment:   With the patient's presentation of fever cough congestion, most likely diagnosis is developing viral upper respiratory infection. Other diagnoses were considered including (but not limited to) peritonsillar abscess, retropharyngeal abscess, pneumonia. These are considered less likely due to history of present illness and physical exam findings.   This is most consistent with an acute life/limb threatening illness complicated by underlying chronic conditions. Considered meningitis, however patient's symptoms, vital  signs, physical exam findings including lack of meningismus seem grossly less consistent at this time. Initial Plan:  Viral screening including COVID/flu testing to evaluate for common viral etiologies that need to be tracked Empiric treatment with antipyretics including acetaminophen in ambulatory setting Will augment with dose of ketorolac in ED  As patient has sore throat, CENTOR Score dictates the following evaluation: Rapid strep Objective evaluation as below reviewed   Initial Study Results:   Laboratory  All laboratory results reviewed without evidence of clinically relevant pathology.      Final Assessment and Plan:   On reassessment, patient is ambulatory tolerating p.o. intake in no acute distress.   Asynchronous follow up of covid test. Patient is currently stable for outpatient care and management with no indication for hospitalization or transfer at this time.  Discussed all findings with patient expressed understanding.  Disposition:  Based on the above findings, I believe patient is stable for discharge.    Patient/family educated about specific return precautions for given chief complaint and symptoms.  Patient/family educated about follow-up with PCP.     Patient/family expressed understanding of return precautions and need for follow-up. Patient spoken to regarding all imaging and laboratory results and appropriate follow up for these results. All education provided in verbal form with additional information in written form. Time was allowed for answering of patient questions. Patient discharged.    Emergency Department Medication Summary:   Medications - No data to display         Clinical Impression:  1. Viral pharyngitis      Discharge   Clinical Impression:  1. Viral pharyngitis      Discharge   Final Clinical Impression(s) / ED Diagnoses Final diagnoses:  Viral pharyngitis    Rx / DC Orders ED Discharge Orders     None          Glyn Ade, MD 02/19/23 1616

## 2023-02-19 NOTE — ED Triage Notes (Signed)
Pt c/o sore throat since yesterday; c/o bil ear pain and slight HA now

## 2023-07-27 ENCOUNTER — Emergency Department (HOSPITAL_BASED_OUTPATIENT_CLINIC_OR_DEPARTMENT_OTHER)

## 2023-07-27 ENCOUNTER — Other Ambulatory Visit: Payer: Self-pay

## 2023-07-27 ENCOUNTER — Encounter (HOSPITAL_BASED_OUTPATIENT_CLINIC_OR_DEPARTMENT_OTHER): Payer: Self-pay | Admitting: Emergency Medicine

## 2023-07-27 ENCOUNTER — Emergency Department (HOSPITAL_BASED_OUTPATIENT_CLINIC_OR_DEPARTMENT_OTHER)
Admission: EM | Admit: 2023-07-27 | Discharge: 2023-07-27 | Disposition: A | Discharge status: Home / Self Care | Attending: Emergency Medicine | Admitting: Emergency Medicine

## 2023-07-27 DIAGNOSIS — R2243 Localized swelling, mass and lump, lower limb, bilateral: Secondary | ICD-10-CM | POA: Diagnosis not present

## 2023-07-27 DIAGNOSIS — Z79899 Other long term (current) drug therapy: Secondary | ICD-10-CM | POA: Insufficient documentation

## 2023-07-27 DIAGNOSIS — M79604 Pain in right leg: Secondary | ICD-10-CM | POA: Insufficient documentation

## 2023-07-27 MED ORDER — NAPROXEN 500 MG PO TABS: 500.0000 mg | ORAL_TABLET | Freq: Two times a day (BID) | ORAL | 0 refills | Status: AC

## 2023-07-27 NOTE — ED Provider Notes (Cosign Needed Addendum)
 [place] [facility] Provider Note   CSN: [id number] Arrival date & time: 07/27/23  1107     History  Chief Complaint  Patient presents with   Leg Pain    Raven Coleman is a 58 y.o. female.   Leg Pain Patient is a 58 year old female who presents to the ED today with right sided calf and popliteal "aching" that began 2 weeks ago.  States that ibuprofen and Tylenol have not been helping.  Reports having to be on her feet a lot for work.  Denies numbness, weakness, tingling, chest pain, shortness breath, abdominal pain, unilateral weakness.      Home Medications Prior to Admission medications   Medication Sig Start Date End Date Taking? Authorizing Provider  naproxen (NAPROSYN) 500 MG tablet Take 1 tablet (500 mg total) by mouth 2 (two) times daily. 07/27/23  Yes Lunette Stands, PA-C  cyclobenzaprine (FLEXERIL) 10 MG tablet Take 1 tablet (10 mg total) by mouth at bedtime. 10/25/20   Renne Crigler, PA-C  HYDROCHLOROTHIAZIDE PO Take by mouth.    [provider]  LISINOPRIL PO Take by mouth.    [provider]  meloxicam (MOBIC) 7.5 MG tablet Take 1 tablet (7.5 mg total) by mouth daily. 10/25/20   Renne Crigler, PA-C      Allergies    Patient has no known allergies.    Review of Systems   Review of Systems  Musculoskeletal:  Positive for myalgias.  All other systems reviewed and are negative.   Physical Exam Updated Vital Signs BP (!) 141/87   Pulse 60   Temp (!) 97.2 F (36.2 C)   Resp 20   Ht 5' (1.524 m)   Wt 98.4 kg   SpO2 100%   BMI 42.38 kg/m  Physical Exam Vitals and nursing note reviewed.  Constitutional:      General: She is not in acute distress.    Appearance: Normal appearance. She is not ill-appearing.  HENT:     Head: Normocephalic and atraumatic.  Eyes:     General: No scleral icterus.       Right eye: No discharge.        Left eye: No discharge.     Extraocular Movements: Extraocular  movements intact.     Conjunctiva/sclera: Conjunctivae normal.  Cardiovascular:     Rate and Rhythm: Normal rate and regular rhythm.     Pulses: Normal pulses.     Heart sounds: Normal heart sounds. No murmur heard.    No friction rub. No gallop.  Pulmonary:     Effort: Pulmonary effort is normal. No respiratory distress.     Breath sounds: Normal breath sounds. No stridor. No wheezing, rhonchi or rales.  Abdominal:     General: Abdomen is flat. There is no distension.     Palpations: Abdomen is soft.     Tenderness: There is no abdominal tenderness. There is no right CVA tenderness, left CVA tenderness or guarding.  Musculoskeletal:        General: No tenderness, deformity or signs of injury.     Cervical back: No rigidity or tenderness.     Right lower leg: Edema present.     Left lower leg: Edema present.     Comments: To have mild swelling to bilateral lower legs.  DP pulse and PT pulses 2+ bilaterally.  Sensation intact with normal neuroexam and regular strength with flexion and extension.  Lymphadenopathy:     Cervical: No cervical  adenopathy.  Skin:    General: Skin is warm and dry.     Capillary Refill: Capillary refill takes less than 2 seconds.     Coloration: Skin is not pale.     Findings: No bruising, erythema, lesion or rash.  Neurological:     General: No focal deficit present.     Mental Status: She is alert and oriented to person, place, and time. Mental status is at baseline.     Sensory: No sensory deficit.     Motor: No weakness.     Coordination: Coordination normal.     Gait: Gait normal.  Psychiatric:        Mood and Affect: Mood normal.     ED Results / Procedures / Treatments   Labs (all labs ordered are listed, but only abnormal results are displayed) Labs Reviewed - No data to display  EKG None  Radiology US Venous Img Lower Unilateral Right Result Date: 07/27/2023 CLINICAL DATA:  Right calf pain and knee pain. EXAM: RIGHT LOWER EXTREMITY  VENOUS DOPPLER ULTRASOUND TECHNIQUE: Gray-scale sonography with graded compression, as well as color Doppler and duplex ultrasound were performed to evaluate the lower extremity deep venous systems from the level of the common femoral vein and including the common femoral, femoral, profunda femoral, popliteal and calf veins including the posterior tibial, peroneal and gastrocnemius veins when visible. The superficial great saphenous vein was also interrogated. Spectral Doppler was utilized to evaluate flow at rest and with distal augmentation maneuvers in the common femoral, femoral and popliteal veins. COMPARISON:  None Available. FINDINGS: Contralateral Common Femoral Vein: Respiratory phasicity is normal and symmetric with the symptomatic side. No evidence of thrombus. Normal compressibility. Common Femoral Vein: No evidence of thrombus. Normal compressibility, respiratory phasicity and response to augmentation. Saphenofemoral Junction: No evidence of thrombus. Normal compressibility and flow on color Doppler imaging. Profunda Femoral Vein: No evidence of thrombus. Normal compressibility and flow on color Doppler imaging. Femoral Vein: No evidence of thrombus. Normal compressibility, respiratory phasicity and response to augmentation. Popliteal Vein: No evidence of thrombus. Normal compressibility, respiratory phasicity and response to augmentation. Calf Veins: No evidence of thrombus. Normal compressibility and flow on color Doppler imaging. Superficial Great Saphenous Vein: No evidence of thrombus. Normal compressibility. Venous Reflux:  None. Other Findings: No evidence of superficial thrombophlebitis or abnormal fluid collection. IMPRESSION: No evidence of right lower extremity deep venous thrombosis. Electronically Signed   By: Irish Lack M.D.   On: 07/27/2023 12:05    Procedures Procedures    Medications Ordered in ED Medications - No data to display  ED Course/ Medical Decision Making/ A&P                                  Medical Decision Making Risk Prescription drug management.   Patient is a 58 year old female who presents to the ED today with right sided calf and popliteal "aching" that began 2 weeks ago.  States that ibuprofen and Tylenol have not been helping.  Reports having to be on her feet a lot for work.  On physical exam, patient is noted to be afebrile, no acute distress, speaking in full sentences.  Legs look to be symmetrical with mild edema bilaterally.  Patient is nontender to palpation to right lower leg.  DP pulse and PT pulses bilaterally are 2+.  Sensation intact.  Strength of both flexion and extension normal.  Homans' sign negative. LCTAB.  DVT ultrasounds were done and shown to be negative in right leg.  Suspect this is most likely due to musculoskeletal cause versus Baker's cyst.  Will have her continue to follow with PCP for further workup.  Low suspicion for any emergent process present at this time.  I believe patient safe to discharge at this time.  Will recommend that she continue to use symptomatic management at home.  Will prescribe naproxen for further pain relief.  I believe patient safe to discharge at this time.  Patient expressed agreement understanding of plan.  All questions answered.  Differential diagnoses prior to evaluation: DVT, muscle strain, trauma, Baker's cyst, ischemic limb, heart failure, lymphedema  Past Medical History / Social History / Additional history: Chart reviewed. Pertinent results include: Has an abnormal ultrasound with a pending ultrasound as of 07/01/2023  Medications / Treatment: Will provide outpatient naproxen   Disposition: After consideration of the diagnostic results and the patients response to treatment, I feel that patient would benefit from discharge and treatment as above.   emergency department workup does not suggest an emergent condition requiring admission or immediate intervention beyond what has  been performed at this time. The plan is: Symptomatic management home, follow-up PCP, return for new or worsening symptoms. The patient is safe for discharge and has been instructed to return immediately for worsening symptoms, change in symptoms or any other concerns.  Final Clinical Impression(s) / ED Diagnoses Final diagnoses:  Right leg pain    Rx / DC Orders ED Discharge Orders          Ordered    naproxen (NAPROSYN) 500 MG tablet  2 times daily        07/27/23 1529              Lunette Stands, [place] 07/27/23 1532    Lunette Stands, PA-C 07/27/23 1534    Rozelle Logan, DO 07/28/23 [phone]

## 2023-07-27 NOTE — ED Triage Notes (Signed)
 Pt with RLE pain (posterior knee and calf) x 1.5 wks, pain medication not helping

## 2023-07-27 NOTE — Discharge Instructions (Addendum)
 You are seen today for right leg pain.  During ultrasound today was negative for a deep vein thrombosis.  With pain most likely being musculoskeletal, I have low suspicion for any emergent pathology present at this time.  You can also follow-up with PCP for further imaging to evaluate whether or not a Baker's cyst or any other arthritis is present causing this pain.  I have prescribed a naproxen.   Please take Naprosyn, 500mg  by mouth twice daily as needed for pain - this in an antiinflammatory medicine (NSAID) and is similar to ibuprofen - many people feel that it is stronger than ibuprofen and it is easier to take since it is a smaller pill.  Please use this only for 1 week - if your pain persists, you will need to follow up with your doctor in the office for ongoing guidance and pain control.     I would recommend not taking this with your ibuprofen or meloxicam as these both act as very similar mechanisms and may cause stomach issues if you take these at the same time.  Return to the ED for any new or worsening symptoms.
